# Patient Record
Sex: Female | Born: 1985 | Race: Black or African American | Hispanic: No | Marital: Single | Smoking: Current every day smoker
Health system: Southern US, Community
[De-identification: ages and names within clinical notes are randomized; demographics above are authoritative.]

## PROBLEM LIST (undated history)

## (undated) ENCOUNTER — Ambulatory Visit (HOSPITAL_COMMUNITY): Discharge status: Absent without leave | Payer: Medicaid Other

## (undated) ENCOUNTER — Emergency Department (HOSPITAL_COMMUNITY): Payer: No Typology Code available for payment source

## (undated) DIAGNOSIS — O24419 Gestational diabetes mellitus in pregnancy, unspecified control: Secondary | ICD-10-CM

## (undated) DIAGNOSIS — J45909 Unspecified asthma, uncomplicated: Secondary | ICD-10-CM

## (undated) DIAGNOSIS — N390 Urinary tract infection, site not specified: Secondary | ICD-10-CM

## (undated) DIAGNOSIS — O009 Unspecified ectopic pregnancy without intrauterine pregnancy: Secondary | ICD-10-CM

## (undated) HISTORY — PX: SALPINGECTOMY: SHX328

---

## 2012-10-17 DIAGNOSIS — O009 Unspecified ectopic pregnancy without intrauterine pregnancy: Secondary | ICD-10-CM

## 2012-10-17 HISTORY — DX: Unspecified ectopic pregnancy without intrauterine pregnancy: O00.90

## 2016-07-08 ENCOUNTER — Encounter (HOSPITAL_COMMUNITY): Payer: Self-pay

## 2016-07-08 ENCOUNTER — Emergency Department (HOSPITAL_COMMUNITY)
Admission: EM | Admit: 2016-07-08 | Discharge: 2016-07-08 | Disposition: A | Discharge status: Home / Self Care | Payer: Medicaid Other | Attending: Emergency Medicine | Admitting: Emergency Medicine

## 2016-07-08 DIAGNOSIS — J45909 Unspecified asthma, uncomplicated: Secondary | ICD-10-CM | POA: Insufficient documentation

## 2016-07-08 DIAGNOSIS — F17200 Nicotine dependence, unspecified, uncomplicated: Secondary | ICD-10-CM | POA: Insufficient documentation

## 2016-07-08 DIAGNOSIS — M62830 Muscle spasm of back: Secondary | ICD-10-CM | POA: Diagnosis not present

## 2016-07-08 DIAGNOSIS — M62838 Other muscle spasm: Secondary | ICD-10-CM

## 2016-07-08 HISTORY — DX: Unspecified ectopic pregnancy without intrauterine pregnancy: O00.90

## 2016-07-08 HISTORY — DX: Unspecified asthma, uncomplicated: J45.909

## 2016-07-08 MED ORDER — METHOCARBAMOL 500 MG PO TABS: 500.0000 mg | ORAL_TABLET | Freq: Two times a day (BID) | ORAL | 0 refills | Status: DC

## 2016-07-08 MED ORDER — IBUPROFEN 600 MG PO TABS: 600.0000 mg | ORAL_TABLET | Freq: Four times a day (QID) | ORAL | 0 refills | Status: DC | PRN

## 2016-07-08 NOTE — ED Triage Notes (Signed)
Pt reports neck spasms to left side of her neck and is painful to turn her head to the left and radiates down top her upper back onset 4 days ago. A&OX4.

## 2016-07-08 NOTE — ED Provider Notes (Signed)
[facility] Provider Note   CSN: [id number] Arrival date & time: 07/08/16  1314  By signing my name below, I, Linna DarnerRussell Turner, attest that this documentation has been prepared under the direction and in the presence of Melburn HakeNicole Hertha Gergen, [place]. Electronically Signed: Linna Darnerussell Turner, Scribe. 07/08/2016. 2:58 PM.  History   Chief Complaint Chief Complaint  Patient presents with  . neck spasms    The history is provided by the patient. No language interpreter was used.     HPI Comments: Darlene Gray is a 30 y.o. female who presents to the Emergency Department complaining of sudden onset, constant, left-sided neck pain for the last 4-5 days. Pt reports she has been having spasms in the left side of her neck. She endorses associated left trapezius pain and spasms as well as left-sided neck stiffness. She states her left-sided neck pain began to radiate into her head yesterday; she notes the pain does not radiate down her left arm. Pt reports her pain is worse when she sleeps on her left side or turns her head to the left. She notes she has tried stretching her neck and has used Bengay with no relief of her pain. Pt has not tried any oral medications for her pain. 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 She is right hand dominant. Pt denies recent falls or injuries. Pt further denies fever, HA, left shoulder pain, midline back pain, numbness/tingling, fever, CP, SOB, abdominal pain, or any other associated symptoms.  Past Medical History:  Diagnosis Date  . Asthma   . Ectopic pregnancy     There are no active problems to display for this patient.   History reviewed. No pertinent surgical history.  OB History    No data available       Home Medications    Prior to Admission medications   Not on File    Family History No family history on file.  Social History Social History  Substance Use  Topics  . Smoking status: Current Every Day Smoker  . Smokeless tobacco: Never Used  . Alcohol use Yes     Allergies   Shellfish allergy   Review of Systems Review of Systems  Constitutional: Negative for fever.  Respiratory: Negative for shortness of breath.   Cardiovascular: Negative for chest pain.  Gastrointestinal: Negative for abdominal pain.  Musculoskeletal: Positive for myalgias (left trapezius), neck pain (left side) and neck stiffness (left side).  Neurological: Negative for numbness.  All other systems reviewed and are negative.   Physical Exam Updated Vital Signs BP 124/86   Pulse 106   Temp 98.9 F (37.2 C) (Oral)   Resp 18   LMP 07/08/2016   SpO2 100%   Physical Exam  Constitutional: She is oriented to person, place, and time. She appears well-developed and well-nourished.  HENT:  Head: Normocephalic and atraumatic.  Eyes: Conjunctivae and EOM are normal. Right eye exhibits no discharge. Left eye exhibits no discharge. No scleral icterus.  Neck: Normal range of motion. Neck supple.  Cardiovascular: Normal rate, regular rhythm, normal heart sounds and intact distal pulses.   Heart rate 92.  Pulmonary/Chest: Effort normal and breath sounds normal.  Abdominal: Soft. She exhibits no distension.  Musculoskeletal: She exhibits no edema.  Tenderness to palpation over left cervical paraspinal muscles and left upper trapezius, no palpable muscle spasm noted. No midline C, T, or L tenderness. Full range  of motion of neck and back. Full range of motion of bilateral upper and lower extremities, with 5/5 strength. Sensation intact. 2+ radial and PT pulses. Cap refill <2 seconds. Patient able to stand and ambulate without assistance.   Neurological: She is alert and oriented to person, place, and time.  Skin: Skin is warm and dry.  Nursing note and vitals reviewed.   ED Treatments / Results  Labs (all labs ordered are listed, but only abnormal results are  displayed) Labs Reviewed - No data to display  EKG  EKG Interpretation None       Radiology No results found.  Procedures Procedures (including critical care time)  DIAGNOSTIC STUDIES: Oxygen Saturation is 100% on RA, normal by my interpretation.    COORDINATION OF CARE: 2:58 PM Discussed treatment plan with pt at bedside and pt agreed to plan.  Medications Ordered in ED Medications - No data to display   Initial Impression / Assessment and Plan / ED Course  I have reviewed the triage vital signs and the nursing notes.  Pertinent labs & imaging results that were available during my care of the patient were reviewed by me and considered in my medical decision making (see chart for details).  Clinical Course   Pt presents with left sided neck pain. Denies any recent known injury or trauma. VSS. Exam revealed TTP over left cervocal paraspinal muscles and upper trapezius. Remaining exam unremarkable. No midline tenderness. No neuro deficits. Suspect muscle strain/spasm. Plan to d/c pt home with NSAIDs, muscle relaxant and symptomatic tx. Advised to follow up with PCP as needed. Discussed return precautions.  I personally performed the services described in this documentation, which was scribed in my presence. The recorded information has been reviewed and is accurate.   Final Clinical Impressions(s) / ED Diagnoses   Final diagnoses:  None    New Prescriptions New Prescriptions   No medications on file     Barrett Henle, PA-C 07/08/16 1625    Zadie Rhine, MD 07/09/16 [phone]

## 2016-07-08 NOTE — Discharge Instructions (Signed)
Take your medication as prescribed as he ever pain relief. I also recommend applying heat to affected area for 15 minutes 3-4 times daily. Please follow up with a primary care provider from the Resource Guide provided below in one week if your symptoms have not improved. Please return to the Emergency Department if symptoms worsen or new onset of fever, headache, visual changes, lightheadedness, dizziness, neck stiffness, numbness, tingling, weakness.

## 2016-10-07 ENCOUNTER — Encounter (HOSPITAL_COMMUNITY): Payer: Self-pay | Admitting: Family Medicine

## 2016-10-07 ENCOUNTER — Ambulatory Visit (HOSPITAL_COMMUNITY)
Admission: EM | Admit: 2016-10-07 | Discharge: 2016-10-07 | Disposition: A | Discharge status: Home / Self Care | Payer: Medicaid Other | Attending: Family Medicine | Admitting: Family Medicine

## 2016-10-07 DIAGNOSIS — J069 Acute upper respiratory infection, unspecified: Secondary | ICD-10-CM | POA: Diagnosis not present

## 2016-10-07 DIAGNOSIS — B9789 Other viral agents as the cause of diseases classified elsewhere: Secondary | ICD-10-CM

## 2016-10-07 DIAGNOSIS — J4541 Moderate persistent asthma with (acute) exacerbation: Secondary | ICD-10-CM | POA: Diagnosis not present

## 2016-10-07 MED ORDER — DEXAMETHASONE SODIUM PHOSPHATE 10 MG/ML IJ SOLN
INTRAMUSCULAR | Status: AC
  Filled 2016-10-07: qty 1

## 2016-10-07 MED ORDER — IPRATROPIUM-ALBUTEROL 0.5-2.5 (3) MG/3ML IN SOLN
RESPIRATORY_TRACT | Status: AC
  Filled 2016-10-07: qty 3

## 2016-10-07 MED ORDER — IPRATROPIUM-ALBUTEROL 0.5-2.5 (3) MG/3ML IN SOLN
3.0000 mL | Freq: Once | RESPIRATORY_TRACT | Status: AC
  Administered 2016-10-07: 3 mL via RESPIRATORY_TRACT

## 2016-10-07 MED ORDER — ALBUTEROL SULFATE (2.5 MG/3ML) 0.083% IN NEBU
INHALATION_SOLUTION | RESPIRATORY_TRACT | Status: AC
  Filled 2016-10-07: qty 3

## 2016-10-07 MED ORDER — DEXAMETHASONE SODIUM PHOSPHATE 10 MG/ML IJ SOLN
10.0000 mg | Freq: Once | INTRAMUSCULAR | Status: AC
  Administered 2016-10-07: 10 mg via INTRAMUSCULAR

## 2016-10-07 MED ORDER — ALBUTEROL SULFATE (2.5 MG/3ML) 0.083% IN NEBU: 2.5000 mg | INHALATION_SOLUTION | Freq: Four times a day (QID) | RESPIRATORY_TRACT | 0 refills | Status: DC | PRN

## 2016-10-07 MED ORDER — PREDNISONE 50 MG PO TABS: ORAL_TABLET | ORAL | 0 refills | Status: DC

## 2016-10-07 MED ORDER — ALBUTEROL SULFATE (2.5 MG/3ML) 0.083% IN NEBU
2.5000 mg | INHALATION_SOLUTION | Freq: Once | RESPIRATORY_TRACT | Status: AC
  Administered 2016-10-07: 2.5 mg via RESPIRATORY_TRACT

## 2016-10-07 NOTE — ED Provider Notes (Signed)
CSN: [id number]     Arrival date & time 10/07/16  1803 History   First MD Initiated Contact with Patient 10/07/16 1837     Chief Complaint  Patient presents with  . URI  . Fever   (Consider location/radiation/quality/duration/timing/severity/associated sxs/prior Treatment) 30 year old female states that she is been having an asthma attack today. She has also developed fever in the last 24 hours. Complaining of cough, myalgias, headache, fatigue and malaise. She has lost her albuterol inhalers. She is taking no medications for symptoms.      Past Medical History:  Diagnosis Date  . Asthma   . Ectopic pregnancy    History reviewed. No pertinent surgical history. History reviewed. No pertinent family history. Social History  Substance Use Topics  . Smoking status: Current Every Day Smoker  . Smokeless tobacco: Never Used  . Alcohol use Yes   OB History    No data available     Review of Systems  Constitutional: Positive for activity change, chills, fatigue and fever. Negative for appetite change.  HENT: Positive for congestion, ear pain, postnasal drip and rhinorrhea. Negative for facial swelling.   Eyes: Negative.   Respiratory: Positive for cough, shortness of breath and wheezing.   Cardiovascular: Negative.   Gastrointestinal: Negative.   Musculoskeletal: Negative for neck pain and neck stiffness.  Skin: Negative for pallor and rash.  Neurological: Negative.     Allergies  Shellfish allergy  Home Medications   Prior to Admission medications   Medication Sig Start Date End Date Taking? Authorizing Provider  albuterol (PROVENTIL) (2.5 MG/3ML) 0.083% nebulizer solution Take 3 mLs (2.5 mg total) by nebulization every 6 (six) hours as needed for wheezing or shortness of breath. 10/07/16   Darlene Rasmussenavid [employer], NP  ibuprofen (ADVIL,MOTRIN) 600 MG tablet Take 1 tablet (600 mg total) by mouth every 6 (six) hours as needed. 07/08/16   Barrett HenleNicole Elizabeth Nadeau, PA-C  methocarbamol  (ROBAXIN) 500 MG tablet Take 1 tablet (500 mg total) by mouth 2 (two) times daily. 07/08/16   Barrett HenleNicole Elizabeth Nadeau, PA-C  predniSONE (DELTASONE) 50 MG tablet 1 tab po daily for 6 days. Take with food. 10/07/16   Darlene Rasmussenavid Emmaus Brandi, NP   Meds Ordered and Administered this Visit   Medications  ipratropium-albuterol (DUONEB) 0.5-2.5 (3) MG/3ML nebulizer solution 3 mL (3 mLs Nebulization Given 10/07/16 1915)  albuterol (PROVENTIL) (2.5 MG/3ML) 0.083% nebulizer solution 2.5 mg (2.5 mg Nebulization Given 10/07/16 1915)  dexamethasone (DECADRON) injection 10 mg (10 mg Intramuscular Given 10/07/16 1915)    BP 135/75   Pulse 94   Temp 99.8 F (37.7 C)   Resp 18   LMP 09/23/2016   SpO2 100%  No data found.   Physical Exam  Constitutional: She is oriented to person, place, and time. She appears well-developed and well-nourished. No distress.  HENT:  Head: Normocephalic and atraumatic.  Bilateral TMs with minor retraction otherwise clear.  Oropharynx with minor PND otherwise clear. Airway patent. No exudates.  Eyes: EOM are normal.  Neck: Normal range of motion. Neck supple.  Cardiovascular: Normal rate and regular rhythm.   Pulmonary/Chest: Effort normal. No respiratory distress. She has wheezes.  Lungs with only fair air movement. No adventitious sounds with low tidal volume. With forced cough the patient reveals diffuse coarseness and wheeze.  Musculoskeletal: Normal range of motion. She exhibits no edema.  Lymphadenopathy:    She has no cervical adenopathy.  Neurological: She is alert and oriented to person, place, and time.  Skin: Skin is warm  and dry. No rash noted.  Psychiatric: She has a normal mood and affect.  Nursing note and vitals reviewed.   Urgent Care Course   Clinical Course     Procedures (including critical care time)  Labs Review Labs Reviewed - No data to display  Imaging Review No results found.   Visual Acuity Review  Right Eye Distance:   Left Eye  Distance:   Bilateral Distance:    Right Eye Near:   Left Eye Near:    Bilateral Near:         MDM   1. Viral upper respiratory tract infection   2. Moderate persistent asthma with acute exacerbation    Dyspnea patient states she is breathing and feeling much better. Lungs with improved air movement and decrease wheeze. Sudafed PE 10 mg every 4 to 6 hours as needed for congestion Allegra or Zyrtec daily as needed for drainage and runny nose. For stronger antihistamine may take Chlor-Trimeton 2 to 4 mg every 4 to 6 hours, may cause drowsiness. Saline nasal spray used frequently. Ibuprofen 600 mg every 6 hours as needed for pain, discomfort or fever. Drink plenty of fluids and stay well-hydrated. Meds ordered this encounter  Medications  . ipratropium-albuterol (DUONEB) 0.5-2.5 (3) MG/3ML nebulizer solution 3 mL  . albuterol (PROVENTIL) (2.5 MG/3ML) 0.083% nebulizer solution 2.5 mg  . dexamethasone (DECADRON) injection 10 mg  . predniSONE (DELTASONE) 50 MG tablet    Sig: 1 tab po daily for 6 days. Take with food.    Dispense:  6 tablet    Refill:  0    Order Specific Question:   Supervising Provider    Answer:   Linna HoffKINDL, JAMES D [phone][[id number]]  . albuterol (PROVENTIL) (2.5 MG/3ML) 0.083% nebulizer solution    Sig: Take 3 mLs (2.5 mg total) by nebulization every 6 (six) hours as needed for wheezing or shortness of breath.    Dispense:  30 mL    Refill:  0    Order Specific Question:   Supervising Provider    Answer:   Linna HoffKINDL, JAMES D [[id number]]       Darlene Rasmussenavid Satcha Storlie, NP 10/07/16 1958

## 2016-10-07 NOTE — ED Triage Notes (Signed)
Pt here for fever, body aches, cough and asthma issues.

## 2016-10-07 NOTE — Discharge Instructions (Signed)
Sudafed PE 10 mg every 4 to 6 hours as needed for congestion °Allegra or Zyrtec daily as needed for drainage and runny nose. °For stronger antihistamine may take Chlor-Trimeton 2 to 4 mg every 4 to 6 hours, may cause drowsiness. °Saline nasal spray used frequently. °Ibuprofen 600 mg every 6 hours as needed for pain, discomfort or fever. °Drink plenty of fluids and stay well-hydrated. °

## 2018-03-19 ENCOUNTER — Encounter (HOSPITAL_COMMUNITY): Payer: Self-pay | Admitting: Emergency Medicine

## 2018-03-19 ENCOUNTER — Ambulatory Visit (HOSPITAL_COMMUNITY)
Admission: EM | Admit: 2018-03-19 | Discharge: 2018-03-19 | Disposition: A | Discharge status: Home / Self Care | Payer: Self-pay | Attending: Internal Medicine | Admitting: Internal Medicine

## 2018-03-19 DIAGNOSIS — M25511 Pain in right shoulder: Secondary | ICD-10-CM

## 2018-03-19 DIAGNOSIS — J4520 Mild intermittent asthma, uncomplicated: Secondary | ICD-10-CM

## 2018-03-19 DIAGNOSIS — M542 Cervicalgia: Secondary | ICD-10-CM

## 2018-03-19 DIAGNOSIS — W108XXA Fall (on) (from) other stairs and steps, initial encounter: Secondary | ICD-10-CM

## 2018-03-19 DIAGNOSIS — M62838 Other muscle spasm: Secondary | ICD-10-CM

## 2018-03-19 DIAGNOSIS — W19XXXA Unspecified fall, initial encounter: Secondary | ICD-10-CM

## 2018-03-19 MED ORDER — ALBUTEROL SULFATE (2.5 MG/3ML) 0.083% IN NEBU: 2.5000 mg | INHALATION_SOLUTION | Freq: Four times a day (QID) | RESPIRATORY_TRACT | 0 refills | Status: DC | PRN

## 2018-03-19 MED ORDER — CYCLOBENZAPRINE HCL 10 MG PO TABS: 10.0000 mg | ORAL_TABLET | Freq: Every day | ORAL | 0 refills | Status: DC

## 2018-03-19 MED ORDER — NAPROXEN 500 MG PO TABS: 500.0000 mg | ORAL_TABLET | Freq: Two times a day (BID) | ORAL | 0 refills | Status: DC

## 2018-03-19 MED ORDER — HYDROCODONE-ACETAMINOPHEN 5-325 MG PO TABS: 2.0000 | ORAL_TABLET | Freq: Four times a day (QID) | ORAL | 0 refills | Status: AC | PRN

## 2018-03-19 NOTE — ED Triage Notes (Signed)
Pt sts right sided neck and shoulder pain after fall on Saturday

## 2018-03-19 NOTE — Discharge Instructions (Signed)
Continue conservative management of rest, ice, heat, and gentle stretches Take naproxen as needed for pain relief (may cause abdominal discomfort, ulcers, and GI bleeds avoid taking with other NSAIDs) Norco prescribed.  Take as needed for break-through pain.  Do not take with other medications such as xanax, this may depress your breathing.  Do not drive or operate heavy machinery while taking this medication.   Take cyclobenzaprine at nighttime for symptomatic relief. Avoid driving or operating heavy machinery while using medication. PCP assistance initiated to help assist you with establishing care with a PCP in the area Return or go to the ER if you have any new or worsening symptoms (fever, chills, chest pain, abdominal pain, changes in bowel or bladder habits, pain radiating into lower legs, etc...)   Patient also requests albuterol inhaler refilled.

## 2018-03-19 NOTE — ED Provider Notes (Addendum)
[facility]   [id number] 03/19/18 Arrival Time: 1725  SUBJECTIVE: History from: patient. Darlene Gray is a 32 y.o. female complains of right side of neck and shoulder pain that began 2 days ago.  It began after she tripped and fell down approximately 8 steps, but caught herself with her right arm on the bannister.  Localizes the pain to the right shoulder and radiates towards neck.  Describes the pain as constant and sharp in character.  Has tried xanax and OTC pain medication without relief.  Symptoms are made worse with neck ROM.  Reports having physical therapy in the past for the right shoulder following a previous injury.  Denies fever, chills, erythema, ecchymosis, effusion, weakness, numbness and tingling.      Patient also has hx of asthma and requests inhaler to be filled today.    ROS: As per HPI.  Past Medical History:  Diagnosis Date  . Asthma   . Ectopic pregnancy    History reviewed. No pertinent surgical history. Allergies  Allergen Reactions  . Shellfish Allergy     Swelling    No current facility-administered medications on file prior to encounter.    Current Outpatient Medications on File Prior to Encounter  Medication Sig Dispense Refill  . ibuprofen (ADVIL,MOTRIN) 600 MG tablet Take 1 tablet (600 mg total) by mouth every 6 (six) hours as needed. 30 tablet 0  . methocarbamol (ROBAXIN) 500 MG tablet Take 1 tablet (500 mg total) by mouth 2 (two) times daily. (Patient not taking: Reported on 03/19/2018) 20 tablet 0  . predniSONE (DELTASONE) 50 MG tablet 1 tab po daily for 6 days. Take with food. 6 tablet 0   Social History   Socioeconomic History  . Marital status: Single    Spouse name: Not on file  . Number of children: Not on file  . Years of education: Not on file  . Highest education level: Not on file  Occupational History  . Not on file  Social Needs  . Financial resource strain: Not on file  . Food insecurity:    Worry: Not on file   Inability: Not on file  . Transportation needs:    Medical: Not on file    Non-medical: Not on file  Tobacco Use  . Smoking status: Current Every Day Smoker  . Smokeless tobacco: Never Used  Substance and Sexual Activity  . Alcohol use: Yes  . Drug use: Not on file  . Sexual activity: Not on file  Lifestyle  . Physical activity:    Days per week: Not on file    Minutes per session: Not on file  . Stress: Not on file  Relationships  . Social connections:    Talks on phone: Not on file    Gets together: Not on file    Attends religious service: Not on file    Active member of club or organization: Not on file    Attends meetings of clubs or organizations: Not on file    Relationship status: Not on file  . Intimate partner violence:    Fear of current or ex partner: Not on file    Emotionally abused: Not on file    Physically abused: Not on file    Forced sexual activity: Not on file  Other Topics Concern  . Not on file  Social History Narrative  . Not on file   History reviewed. No pertinent family history.  OBJECTIVE:  Vitals:   03/19/18 1826  BP:  129/87  Pulse: 93  Resp: 18  Temp: 98.6 F (37 C)  TempSrc: Oral  SpO2: 100%    General appearance: AOx3; in no acute distress.  Head: NCAT Lungs: CTA bilaterally Heart: RRR.  Clear S1 and S2 without murmur, gallops, or rubs.  Radial pulses 2+ bilaterally. Musculoskeletal:Neck and right shoulder  Inspection: Skin warm, dry, clear and intact without obvious erythema, effusion, or ecchymosis.  Palpation: diffusely tender about the sternocleidomastoid; nontender about the clavicle, AC joint, shoulder joint, humerus, or trapezius.  No c-spine tenderness ROM: FROM active and passive Strength: 5/5 shld abduction, 5/5 shld adduction, 5/5 elbow flexion, 5/5 elbow extension, 5/5 grip strength Sensation intact about the UE Skin: warm and dry Neurologic: Ambulates without difficulty Psychological: alert and cooperative;  normal mood and affect   MDM:  Given patient's PE and nature of injury x-rays were not necessary at this time. Patient also states she would like to avoid further radiation exposure if possible.  Nature of injury appears musculoskeletal.  Naproxen prescribed, norco for breakthrough pain, and cyclobenzaprine as needed for nighttime relief.  Strict return and ER precautions given.    ASSESSMENT & PLAN:  1. Fall, initial encounter   2. Muscle spasms of neck   3. Mild intermittent asthma, unspecified whether complicated     Meds ordered this encounter  Medications  . HYDROcodone-acetaminophen (NORCO/VICODIN) 5-325 MG tablet    Sig: Take 2 tablets by mouth every 6 (six) hours as needed for up to 5 days.    Dispense:  10 tablet    Refill:  0    Order Specific Question:   Supervising Provider    Answer:   Isa Rankin [phone]  . cyclobenzaprine (FLEXERIL) 10 MG tablet    Sig: Take 1 tablet (10 mg total) by mouth at bedtime.    Dispense:  12 tablet    Refill:  0    Order Specific Question:   Supervising Provider    Answer:   Isa Rankin [phone]  . naproxen (NAPROSYN) 500 MG tablet    Sig: Take 1 tablet (500 mg total) by mouth 2 (two) times daily.    Dispense:  30 tablet    Refill:  0    Order Specific Question:   Supervising Provider    Answer:   Isa Rankin [phone]  . albuterol (PROVENTIL) (2.5 MG/3ML) 0.083% nebulizer solution    Sig: Take 3 mLs (2.5 mg total) by nebulization every 6 (six) hours as needed for wheezing or shortness of breath.    Dispense:  30 mL    Refill:  0    Order Specific Question:   Supervising Provider    Answer:   Isa Rankin [[id number]]    Continue conservative management of rest, ice, heat, and gentle stretches Take naproxen as needed for pain relief (may cause abdominal discomfort, ulcers, and GI bleeds avoid taking with other NSAIDs) Norco prescribed.  Take as needed for break-through pain.  Do not take with other  medications such as xanax, this may depress your breathing.  Do not drive or operate heavy machinery while taking this medication.   Take cyclobenzaprine at nighttime for symptomatic relief. Avoid driving or operating heavy machinery while using medication. PCP assistance initiated to help assist you with establishing care with a PCP in the area Return or go to the ER if you have any new or worsening symptoms (fever, chills, chest pain, abdominal pain, changes in bowel or bladder habits, pain radiating into lower  legs, etc...)   Patient also requests albuterol inhaler refilled.    [place] Controlled Substances Registry consulted for this patient. I feel the risk/benefit ratio today is favorable for proceeding with this prescription for a controlled substance. Medication sedation precautions given.  Reviewed expectations re: course of current medical issues. Questions answered. Outlined signs and symptoms indicating need for more acute intervention. Patient verbalized understanding. After Visit Summary given.    Rennis Harding, PA-C 03/19/18 2013    Rennis Harding, PA-C 03/19/18 2013

## 2018-11-07 ENCOUNTER — Other Ambulatory Visit (HOSPITAL_COMMUNITY): Payer: Self-pay | Admitting: *Deleted

## 2018-11-07 DIAGNOSIS — N6320 Unspecified lump in the left breast, unspecified quadrant: Secondary | ICD-10-CM

## 2018-11-07 DIAGNOSIS — N644 Mastodynia: Secondary | ICD-10-CM

## 2018-12-03 ENCOUNTER — Encounter (HOSPITAL_COMMUNITY): Payer: Self-pay | Admitting: Emergency Medicine

## 2018-12-03 ENCOUNTER — Ambulatory Visit (HOSPITAL_COMMUNITY)
Admission: EM | Admit: 2018-12-03 | Discharge: 2018-12-03 | Disposition: A | Discharge status: Home / Self Care | Payer: Self-pay | Attending: Internal Medicine | Admitting: Internal Medicine

## 2018-12-03 ENCOUNTER — Other Ambulatory Visit: Payer: Self-pay

## 2018-12-03 DIAGNOSIS — M546 Pain in thoracic spine: Secondary | ICD-10-CM

## 2018-12-03 DIAGNOSIS — M25511 Pain in right shoulder: Secondary | ICD-10-CM

## 2018-12-03 MED ORDER — CYCLOBENZAPRINE HCL 5 MG PO TABS: 5.0000 mg | ORAL_TABLET | Freq: Three times a day (TID) | ORAL | 0 refills | Status: DC | PRN

## 2018-12-03 MED ORDER — MELOXICAM 7.5 MG PO TABS: 7.5000 mg | ORAL_TABLET | Freq: Every day | ORAL | 0 refills | Status: DC

## 2018-12-03 NOTE — ED Triage Notes (Signed)
Woke Saturday with a painful shoulder.  Now having pain in right lower back..  No known injury Patient's [birth year] was [birth year] and woke Saturday with shoulder complaint.  Patient is right handed.

## 2018-12-03 NOTE — ED Provider Notes (Signed)
  MRN: [id number] DOB: [birth year]  Subjective:   Darlene Gray is a 33 y.o. female presenting for 2-day history of persistent sharp, aching right shoulder pain now having right upper and mid back pain.  Has not tried any medications for relief.  She does not take any medications chronically.  Reports that she did drink quite a bit over the weekend for her birthday and thinks she may have slept wrong.  She cannot recall any falls, trauma and denies bruising, redness, warmth, laceration.   Allergies  Allergen Reactions  . Shellfish Allergy     Swelling     Past Medical History:  Diagnosis Date  . Asthma   . Ectopic pregnancy      History reviewed. No pertinent surgical history.  ROS  Objective:   Vitals: BP (!) 137/96 (BP Location: Left Arm)   Pulse 88   Temp 98 F (36.7 C) (Temporal)   Resp 20   SpO2 96%   Physical Exam Constitutional:      General: She is not in acute distress.    Appearance: Normal appearance. She is well-developed. She is not ill-appearing.  HENT:     Head: Normocephalic and atraumatic.     Nose: Nose normal.     Mouth/Throat:     Mouth: Mucous membranes are moist.     Pharynx: Oropharynx is clear.  Eyes:     General: No scleral icterus.    Extraocular Movements: Extraocular movements intact.     Pupils: Pupils are equal, round, and reactive to light.  Cardiovascular:     Rate and Rhythm: Normal rate.  Pulmonary:     Effort: Pulmonary effort is normal.  Musculoskeletal:     Right shoulder: She exhibits decreased range of motion (External rotation, abduction greater than 90 degrees), tenderness (About her deltoids) and spasm (Trapezius muscle). She exhibits no bony tenderness, no swelling, no effusion, no crepitus, no deformity, no laceration and normal strength.     Cervical back: She exhibits tenderness (Over area depicted) and spasm.       Back:  Skin:    General: Skin is warm and dry.  Neurological:     General: No focal deficit  present.     Mental Status: She is alert and oriented to person, place, and time.  Psychiatric:        Mood and Affect: Mood normal.        Behavior: Behavior normal.     Assessment and Plan :   Acute pain of right shoulder  Acute right-sided thoracic back pain  We will use conservative management including daily adequate hydration and meloxicam and Flexeril.  Counseled patient on some shoulder rehab exercises.  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 Counseled patient on potential for adverse effects with medications prescribed today, patient verbalized understanding. ER and return-to-clinic precautions discussed, patient verbalized understanding.    Wallis Bamberg, [place] 12/03/18 1828

## 2018-12-06 ENCOUNTER — Ambulatory Visit
Admission: RE | Admit: 2018-12-06 | Discharge: 2018-12-06 | Disposition: A | Discharge status: Home / Self Care | Payer: Medicaid Other | Source: Ambulatory Visit | Attending: Obstetrics and Gynecology | Admitting: Obstetrics and Gynecology

## 2018-12-06 ENCOUNTER — Ambulatory Visit (HOSPITAL_COMMUNITY)
Admission: RE | Admit: 2018-12-06 | Discharge: 2018-12-06 | Disposition: A | Discharge status: Home / Self Care | Payer: Self-pay | Source: Ambulatory Visit | Attending: Obstetrics and Gynecology | Admitting: Obstetrics and Gynecology

## 2018-12-06 ENCOUNTER — Ambulatory Visit: Payer: Medicaid Other

## 2018-12-06 ENCOUNTER — Encounter (HOSPITAL_COMMUNITY): Payer: Self-pay

## 2018-12-06 VITALS — BP 120/82 | Wt 198.0 lb

## 2018-12-06 DIAGNOSIS — N6320 Unspecified lump in the left breast, unspecified quadrant: Secondary | ICD-10-CM

## 2018-12-06 DIAGNOSIS — N6321 Unspecified lump in the left breast, upper outer quadrant: Secondary | ICD-10-CM

## 2018-12-06 DIAGNOSIS — N644 Mastodynia: Secondary | ICD-10-CM

## 2018-12-06 DIAGNOSIS — Z01419 Encounter for gynecological examination (general) (routine) without abnormal findings: Secondary | ICD-10-CM

## 2018-12-06 DIAGNOSIS — N6322 Unspecified lump in the left breast, upper inner quadrant: Secondary | ICD-10-CM

## 2018-12-06 NOTE — Patient Instructions (Signed)
Explained breast self awareness with Darlene Gray. Let patient know that if today's Pap smear is normal that her next Pap smear is due in one year since she has only had one normal Pap smear since her abnormal Pap smear. Referred [facility] to the [facility] for a diagnostic mammogram and left breast ultrasound. Appointment scheduled for Thursday, December 06, 2018 at 1520. Patient aware of appointment and will be there. Let patient know will follow up with her within the next couple weeks with results of Pap smear by letter or phone. Discussed smoking cessation with patient. Referred to the [facility] Quitline and gave resources to free smoking cessation classes at [facility]. Darlene Gray verbalized understanding.  Darlene Gray, Darlene Maser, RN 2:40 PM

## 2018-12-06 NOTE — Progress Notes (Signed)
Complaints of a left breast lump x 2-3 months and bilateral breast pain x 6 months. Patient states the pain comes and goes. Patient rates the pain at a 6 out of 10.  Pap Smear: Pap smear completed today. Last Pap smear was in 2017 in [place]  and normal per patient. Per patient has a history of an abnormal Pap smear in 2015 that the Pap smear in 2017 was her follow-up. No Pap smear results are in Epic.  Physical exam: Breasts Breasts symmetrical. No skin abnormalities bilateral breasts. No nipple retraction bilateral breasts. No nipple discharge bilateral breasts. No lymphadenopathy. No lumps palpated right breast. Palpated two bb sized mobile lumps within the left breast at 2 o'clock 10 cm from the nipple and 11 o'clock 10 cm from the nipple. No complaints of pain or tenderness on exam. Referred patient to the [facility] for a diagnostic mammogram and left breast ultrasound. Appointment scheduled for Thursday, December 06, 2018 at 1520.        Pelvic/Bimanual   Ext Genitalia No lesions, no swelling and no discharge observed on external genitalia.         Vagina Vagina pink and normal texture. No lesions or discharge observed in vagina.          Cervix Cervix is present. Cervix pink and of normal texture. No discharge observed.     Uterus Uterus is present and palpable. Uterus in normal position and normal size.        Adnexae Bilateral ovaries present and palpable. No tenderness on palpation.         Rectovaginal No rectal exam completed today since patient had no rectal complaints. No skin abnormalities observed on exam.    Smoking History: Patient is a current smoker. Discussed smoking cessation with patient. Referred to the [facility] Quitline and gave resources to free smoking cessation classes at [facility].  Patient Navigation: Patient education provided. Access to services provided for patient through [facility] program.   Breast and Cervical Cancer Risk  Assessment: Patient has no family history of breast cancer, known genetic mutations, or radiation treatment to the chest before age 64. Patient has no history of cervical dysplasia, immunocompromised, or DES exposure in-utero. Breast Cancer risk assessment completed. No breast cancer risk calculated due to patient is less than 42 years old.

## 2018-12-07 ENCOUNTER — Encounter (HOSPITAL_COMMUNITY): Payer: Self-pay | Admitting: *Deleted

## 2018-12-11 LAB — CYTOLOGY - PAP
Diagnosis: UNDETERMINED — AB
HPV: DETECTED — AB

## 2018-12-19 ENCOUNTER — Telehealth (HOSPITAL_COMMUNITY): Payer: Self-pay | Admitting: *Deleted

## 2018-12-19 NOTE — Telephone Encounter (Signed)
Telephoned patient at home number and advised patient of abnormal pap smear results. Advised patient would need colposcopy and was scheduled at [facility] March 11 8:55. Answered all questions about procedure. Patient voiced understanding.

## 2018-12-26 ENCOUNTER — Other Ambulatory Visit: Payer: Self-pay

## 2018-12-26 ENCOUNTER — Encounter: Payer: Self-pay | Admitting: Obstetrics and Gynecology

## 2018-12-26 ENCOUNTER — Other Ambulatory Visit (HOSPITAL_COMMUNITY)
Admission: RE | Admit: 2018-12-26 | Discharge: 2018-12-26 | Disposition: A | Discharge status: Home / Self Care | Payer: No Typology Code available for payment source | Source: Ambulatory Visit | Attending: Obstetrics and Gynecology | Admitting: Obstetrics and Gynecology

## 2018-12-26 ENCOUNTER — Ambulatory Visit (INDEPENDENT_AMBULATORY_CARE_PROVIDER_SITE_OTHER): Payer: Self-pay | Admitting: Obstetrics and Gynecology

## 2018-12-26 VITALS — BP 131/90 | HR 82 | Ht 62.0 in | Wt 204.2 lb

## 2018-12-26 DIAGNOSIS — R87810 Cervical high risk human papillomavirus (HPV) DNA test positive: Secondary | ICD-10-CM | POA: Insufficient documentation

## 2018-12-26 DIAGNOSIS — N870 Mild cervical dysplasia: Secondary | ICD-10-CM

## 2018-12-26 DIAGNOSIS — R87610 Atypical squamous cells of undetermined significance on cytologic smear of cervix (ASC-US): Secondary | ICD-10-CM | POA: Insufficient documentation

## 2018-12-26 LAB — POCT PREGNANCY, URINE: PREG TEST UR: NEGATIVE

## 2018-12-26 NOTE — Progress Notes (Signed)
Colposcopy Procedure Note  Pre-operative Diagnosis: ASCUS, positive high risk HPV 12/06/18 H/o abnormal pap 2015, reports "they wanted to do biopsy, and wanted to repeat pap, if pap normal, then did not need biopsy, then pap was normal"  Post-operative Diagnosis: CIN1  Indications:  ASCUS, positive high risk HPV  Procedure Details  LMP 12/07/18; UPT negative.    The risks (including infection, bleeding, pain) and benefits of the procedure were explained to the patient and written informed consent was obtained.  The patient was placed in the dorsal lithotomy position. A Graves was speculum inserted in the vagina, and the cervix was visualized.  The cervix was stained with acetic acid and visualized using the colposcope under magnification as well as with a green filter. Findings as below. Cervical biopsies were taken at 1, 7, 11. Endocervical canal stenotic, single toothed tenaculum placed onto cerivix. Endocervical curettage then performed in all four quadrants. Tenaculum removed. Small amount of bleeding noted that improved with pressure. Monsel's solution applied with good hemostasis noted. Patient tolerating procedure well.  Findings: acetowhite 360 degrees around os, vascularity at 1 o'clock, 8 o'clock, thickened white area at 11 o'clock  Adequate: yes  Specimens: cervical biopsy, ECC  Condition: Stable  Complications: None  Plan: The patient was advised to call for any fever or for prolonged or severe pain or bleeding. She was advised to use OTC analgesics as needed for mild to moderate pain. She was advised to avoid vaginal intercourse for 48 hours or until the bleeding has completely stopped.   Baldemar Lenis, M.D. Attending Center for [employer] [facility])

## 2018-12-28 ENCOUNTER — Telehealth: Payer: Self-pay

## 2018-12-28 NOTE — Telephone Encounter (Addendum)
-----   Message from Conan Bowens, MD sent at 12/28/2018  8:42 AM EDT ----- Please call patient let her know low grade abnormal cells found on colposcopy and negative ECC. She needs repeat pap + co-testing in 1 yr.  Notified pt results and f/u.  Pt was excited and did not have any other questions.

## 2019-01-11 ENCOUNTER — Encounter: Payer: Self-pay | Admitting: *Deleted

## 2019-03-11 ENCOUNTER — Ambulatory Visit (HOSPITAL_COMMUNITY)
Admission: EM | Admit: 2019-03-11 | Discharge: 2019-03-11 | Disposition: A | Discharge status: Home / Self Care | Payer: No Typology Code available for payment source | Attending: Family Medicine | Admitting: Family Medicine

## 2019-03-11 ENCOUNTER — Other Ambulatory Visit: Payer: Self-pay

## 2019-03-11 ENCOUNTER — Encounter (HOSPITAL_COMMUNITY): Payer: Self-pay

## 2019-03-11 DIAGNOSIS — S46811A Strain of other muscles, fascia and tendons at shoulder and upper arm level, right arm, initial encounter: Secondary | ICD-10-CM

## 2019-03-11 DIAGNOSIS — S161XXA Strain of muscle, fascia and tendon at neck level, initial encounter: Secondary | ICD-10-CM

## 2019-03-11 MED ORDER — IBUPROFEN 800 MG PO TABS: 800.0000 mg | ORAL_TABLET | Freq: Three times a day (TID) | ORAL | 0 refills | Status: DC

## 2019-03-11 MED ORDER — CYCLOBENZAPRINE HCL 5 MG PO TABS: 5.0000 mg | ORAL_TABLET | Freq: Two times a day (BID) | ORAL | 0 refills | Status: DC | PRN
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
## 2019-03-11 NOTE — Discharge Instructions (Signed)
Use anti-inflammatories for pain/swelling. You may take up to 800 mg Ibuprofen every 8 hours with food. You may supplement Ibuprofen with Tylenol [phone] mg every 8 hours.   You may use flexeril as needed to help with pain. This is a muscle relaxer and causes sedation- please use only at bedtime or when you will be home and not have to drive/work-begin with 1 tablet, may increase to 2 if needed  Alternate ice and heat to back/neck  Gentle stretching and range of motion of shoulder exercises  Please follow-up if not having any improvement in symptoms in 1 week, developing increased numbness or tingling

## 2019-05-23 ENCOUNTER — Emergency Department (HOSPITAL_COMMUNITY)
Admission: EM | Admit: 2019-05-23 | Discharge: 2019-05-23 | Disposition: A | Discharge status: Home / Self Care | Payer: Self-pay | Attending: Emergency Medicine | Admitting: Emergency Medicine

## 2019-05-23 ENCOUNTER — Encounter (HOSPITAL_COMMUNITY): Payer: Self-pay | Admitting: Emergency Medicine

## 2019-05-23 ENCOUNTER — Other Ambulatory Visit: Payer: Self-pay

## 2019-05-23 ENCOUNTER — Emergency Department (HOSPITAL_COMMUNITY): Payer: Self-pay

## 2019-05-23 DIAGNOSIS — Z8709 Personal history of other diseases of the respiratory system: Secondary | ICD-10-CM | POA: Insufficient documentation

## 2019-05-23 DIAGNOSIS — R1031 Right lower quadrant pain: Secondary | ICD-10-CM | POA: Insufficient documentation

## 2019-05-23 DIAGNOSIS — F17210 Nicotine dependence, cigarettes, uncomplicated: Secondary | ICD-10-CM | POA: Insufficient documentation

## 2019-05-23 DIAGNOSIS — R109 Unspecified abdominal pain: Secondary | ICD-10-CM

## 2019-05-23 LAB — I-STAT BETA HCG BLOOD, ED (MC, WL, AP ONLY): I-stat hCG, quantitative: <5 m[IU]/mL (ref <5)

## 2019-05-23 LAB — CBC
HCT: 39.5 % (ref 36.0–46.0)
Hemoglobin: 13 g/dL (ref 12.0–15.0)
MCH: 33 pg (ref 26.0–34.0)
MCHC: 32.9 g/dL (ref 30.0–36.0)
MCV: 100.3 fL — ABNORMAL HIGH (ref 80.0–100.0)
Platelets: 325 10*3/uL (ref 150–400)
RBC: 3.94 MIL/uL (ref 3.87–5.11)
RDW: 12.9 % (ref 11.5–15.5)
WBC: 9.1 10*3/uL (ref 4.0–10.5)
nRBC: 0 % (ref 0.0–0.2)

## 2019-05-23 LAB — BASIC METABOLIC PANEL
Anion gap: 11 (ref 5–15)
BUN: 13 mg/dL (ref 6–20)
CO2: 23 mmol/L (ref 22–32)
Calcium: 9 mg/dL (ref 8.9–10.3)
Chloride: 103 mmol/L (ref 98–111)
Creatinine, Ser: 0.97 mg/dL (ref 0.44–1.00)
GFR calc Af Amer: >60 mL/min (ref >60)
GFR calc non Af Amer: >60 mL/min (ref >60)
Glucose, Bld: 104 mg/dL — ABNORMAL HIGH (ref 70–99)
Potassium: 4 mmol/L (ref 3.5–5.1)
Sodium: 137 mmol/L (ref 135–145)

## 2019-05-23 LAB — URINALYSIS, ROUTINE W REFLEX MICROSCOPIC
Bilirubin Urine: NEGATIVE
Glucose, UA: NEGATIVE mg/dL
Ketones, ur: NEGATIVE mg/dL
Leukocytes,Ua: NEGATIVE
Nitrite: NEGATIVE
Protein, ur: NEGATIVE mg/dL
RBC / HPF: >50 RBC/hpf — ABNORMAL HIGH (ref 0–5)
Specific Gravity, Urine: 1.026 (ref 1.005–1.030)
pH: 5 (ref 5.0–8.0)

## 2019-05-23 MED ORDER — KETOROLAC TROMETHAMINE 60 MG/2ML IM SOLN
60.0000 mg | Freq: Once | INTRAMUSCULAR | Status: AC
  Administered 2019-05-23: 60 mg via INTRAMUSCULAR
  Filled 2019-05-23: qty 2

## 2019-05-23 MED ORDER — OXYCODONE-ACETAMINOPHEN 5-325 MG PO TABS
1.0000 | ORAL_TABLET | Freq: Once | ORAL | Status: AC
  Administered 2019-05-23: 05:00:00 1 via ORAL
  Filled 2019-05-23: qty 1

## 2019-05-23 MED ORDER — KETOROLAC TROMETHAMINE 30 MG/ML IJ SOLN: 30.0000 mg | Freq: Once | INTRAMUSCULAR | Status: DC

## 2019-05-23 MED ORDER — METHOCARBAMOL 500 MG PO TABS: 500.0000 mg | ORAL_TABLET | Freq: Three times a day (TID) | ORAL | 0 refills | Status: DC | PRN

## 2019-05-23 MED ORDER — ONDANSETRON 4 MG PO TBDP
4.0000 mg | ORAL_TABLET | Freq: Once | ORAL | Status: AC
  Administered 2019-05-23: 4 mg via ORAL
  Filled 2019-05-23: qty 1

## 2019-05-23 NOTE — ED Triage Notes (Signed)
Patient with right flank pain, states that it has been going on for about 3-4 days.  No nausea or vomiting.  Patient denies any urinary symptoms.  No injury per patient.  She states that she just couldn't get comfortable tonight and the pain was increasing.  She tried OTC ibuprofen with no relief.

## 2019-05-23 NOTE — ED Provider Notes (Signed)
TIME SEEN: 5:00 AM  CHIEF COMPLAINT: Right flank pain  HPI: Patient is a 33 year old female with history of asthma, previous ectopic pregnancy who presents to the emergency department with right flank pain that started yesterday.  No injury to her back that she can recall.  She states pain is worse with movement but not reproducible with palpation.  No fevers, cough, chest pain, shortness of breath, nausea, vomiting, diarrhea, dysuria, hematuria, abnormal vaginal discharge.  She is currently on her menstrual cycle.  She has never had a kidney stone.  No numbness, tingling, focal weakness, bowel or bladder incontinence.  ROS: See HPI Constitutional: no fever  Eyes: no drainage  ENT: no runny nose   Cardiovascular:  no chest pain  Resp: no SOB  GI: no vomiting GU: no dysuria Integumentary: no rash  Allergy: no hives  Musculoskeletal: no leg swelling  Neurological: no slurred speech ROS otherwise negative  PAST MEDICAL HISTORY/PAST SURGICAL HISTORY:  Past Medical History:  Diagnosis Date  . Asthma   . Ectopic pregnancy     MEDICATIONS:  Prior to Admission medications   Medication Sig Start Date End Date Taking? Authorizing Provider  cyclobenzaprine (FLEXERIL) 5 MG tablet Take 1-2 tablets (5-10 mg total) by mouth 2 (two) times daily as needed for muscle spasms. 03/11/19   Wieters, Hallie C, PA-C  ibuprofen (ADVIL) 800 MG tablet Take 1 tablet (800 mg total) by mouth 3 (three) times daily. 03/11/19   Wieters, Hallie C, PA-C    ALLERGIES:  Allergies  Allergen Reactions  . Shellfish Allergy     Swelling     SOCIAL HISTORY:  Social History   Tobacco Use  . Smoking status: Current Every Day Smoker  . Smokeless tobacco: Never Used  Substance Use Topics  . Alcohol use: Yes    Comment: occcassionally    FAMILY HISTORY: Family History  Problem Relation Age of Onset  . Healthy Mother   . Diabetes Maternal Grandmother     EXAM: BP 124/82   Pulse (!) 57   Temp 97.6 F  (36.4 C) (Oral)   Resp 16   LMP 05/22/2019 (Exact Date)   SpO2 99%  CONSTITUTIONAL: Alert and oriented and responds appropriately to questions. Well-appearing; well-nourished HEAD: Normocephalic EYES: Conjunctivae clear, pupils appear equal, EOMI ENT: normal nose; moist mucous membranes NECK: Supple, no meningismus, no nuchal rigidity, no LAD  CARD: RRR; S1 and S2 appreciated; no murmurs, no clicks, no rubs, no gallops RESP: Normal chest excursion without splinting or tachypnea; breath sounds clear and equal bilaterally; no wheezes, no rhonchi, no rales, no hypoxia or respiratory distress, speaking full sentences ABD/GI: Normal bowel sounds; non-distended; soft, non-tender, no rebound, no guarding, no peritoneal signs, no hepatosplenomegaly BACK:  The back appears normal and is non-tender to palpation, there is no CVA tenderness, no midline spinal tenderness or step-off or deformity, no redness or warmth, no ecchymosis or swelling, no rash EXT: Normal ROM in all joints; non-tender to palpation; no edema; normal capillary refill; no cyanosis, no calf tenderness or swelling    SKIN: Normal color for age and race; warm; no rash NEURO: Moves all extremities equally, normal sensation diffusely, normal gait, normal speech PSYCH: The patient's mood and manner are appropriate. Grooming and personal hygiene are appropriate.  MEDICAL DECISION MAKING: Patient with right flank pain.  May be musculoskeletal in nature but pain not reproducible with palpation.  Neurologically intact.  Doubt fracture, cauda equina, epidural abscess or hematoma, discitis or osteomyelitis, transverse myelitis.  I  do not feel she needs emergent imaging of her spine.  She does have blood in her urine but is on her menstrual cycle.  No signs of urinary tract infection or pyelonephritis.  Will obtain CT scan to evaluate for possible stone.  Will give Toradol for pain.  Abdominal exam benign.  Doubt appendicitis, colitis, cholecystitis,  bowel obstruction, perforation.  ED PROGRESS: Patient CT scan shows no acute abnormality.  Gallbladder, appendix, kidneys appear normal.  No bony abnormality seen of her spine.  Suspect musculoskeletal pain.  Will discharge with prescription of Robaxin and recommend alternating Tylenol and Motrin.  Discussed return precautions.   At this time, I do not feel there is any life-threatening condition present. I have reviewed and discussed all results (EKG, imaging, lab, urine as appropriate) and exam findings with patient/family. I have reviewed nursing notes and appropriate previous records.  I feel the patient is safe to be discharged home without further emergent workup and can continue workup as an outpatient as needed. Discussed usual and customary return precautions. Patient/family verbalize understanding and are comfortable with this plan.  Outpatient follow-up has been provided as needed. All questions have been answered.      , Layla MawKristen N, DO 05/23/19 [phone]

## 2019-05-23 NOTE — Discharge Instructions (Signed)
You may alternate Tylenol 1000 mg every 6 hours as needed for pain and Ibuprofen 800 mg every 8 hours as needed for pain.  Please take Ibuprofen with food. ° °

## 2020-08-29 ENCOUNTER — Other Ambulatory Visit: Payer: Self-pay

## 2020-08-29 ENCOUNTER — Ambulatory Visit (HOSPITAL_COMMUNITY)
Admission: EM | Admit: 2020-08-29 | Discharge: 2020-08-29 | Disposition: A | Discharge status: Home / Self Care | Payer: Self-pay | Attending: Internal Medicine | Admitting: Internal Medicine

## 2020-08-29 ENCOUNTER — Ambulatory Visit (INDEPENDENT_AMBULATORY_CARE_PROVIDER_SITE_OTHER): Payer: Self-pay

## 2020-08-29 DIAGNOSIS — M25511 Pain in right shoulder: Secondary | ICD-10-CM

## 2020-08-29 DIAGNOSIS — N760 Acute vaginitis: Secondary | ICD-10-CM

## 2020-08-29 DIAGNOSIS — Z3202 Encounter for pregnancy test, result negative: Secondary | ICD-10-CM

## 2020-08-29 LAB — POC URINE PREG, ED: Preg Test, Ur: NEGATIVE

## 2020-08-29 MED ORDER — KETOROLAC TROMETHAMINE 30 MG/ML IJ SOLN
INTRAMUSCULAR | Status: AC
  Filled 2020-08-29: qty 1

## 2020-08-29 MED ORDER — NYSTATIN-TRIAMCINOLONE 100000-0.1 UNIT/GM-% EX CREA: TOPICAL_CREAM | CUTANEOUS | 0 refills | Status: DC

## 2020-08-29 MED ORDER — PREDNISONE 10 MG (21) PO TBPK: ORAL_TABLET | ORAL | 0 refills | Status: DC

## 2020-08-29 MED ORDER — CYCLOBENZAPRINE HCL 5 MG PO TABS: 5.0000 mg | ORAL_TABLET | Freq: Three times a day (TID) | ORAL | 0 refills | Status: DC | PRN

## 2020-08-29 MED ORDER — FLUCONAZOLE 150 MG PO TABS: 150.0000 mg | ORAL_TABLET | Freq: Every day | ORAL | 0 refills | Status: DC

## 2020-08-29 MED ORDER — KETOROLAC TROMETHAMINE 30 MG/ML IJ SOLN
30.0000 mg | Freq: Once | INTRAMUSCULAR | Status: AC
  Administered 2020-08-29: 30 mg via INTRAMUSCULAR

## 2020-08-29 NOTE — ED Triage Notes (Signed)
PT reports for 2 weeks of rt shoulder pain and has not been able to sleep due to the pain. 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 Pt also reports she has vaginal itching due to meds she has been taking for Shoulder pain.

## 2020-08-29 NOTE — Discharge Instructions (Addendum)
Steroid pack as prescribed Flexeril as needed.  Follow up with sports medicine for continued symptoms.

## 2020-08-31 NOTE — ED Provider Notes (Signed)
[facility]    CSN: [id number] Arrival date & time: 08/29/20  1450      History   Chief Complaint Chief Complaint  Patient presents with  . Shoulder Pain  . Vaginal Itching    HPI Rashan Patient is a 34 y.o. female.   Patient is a 34 year old female who presents today for approximate 2 weeks of right shoulder pain.  This has been constant.  Worse with certain movements and unable to get comfortable during sleep.  Has been taking medicines for this pain that she has gotten from friends without much relief.  Has not been taking the medicine consistently.  Denies any falls or injuries.  Denies any numbness, tingling or weakness in the arm.  Believes she may have a yeast infection due to taking multiple different medications for her pain.      Past Medical History:  Diagnosis Date  . Asthma   . Ectopic pregnancy     There are no problems to display for this patient.   No past surgical history on file.  OB History    Gravida  2   Para  1   Term  1   Preterm      AB  1   Living  1     SAB      TAB      Ectopic  1   Multiple      Live Births  1            Home Medications    Prior to Admission medications   Medication Sig Start Date End Date Taking? Authorizing Provider  cyclobenzaprine (FLEXERIL) 5 MG tablet Take 1 tablet (5 mg total) by mouth 3 (three) times daily as needed for muscle spasms. 08/29/20   Dahlia Byes A, NP  fluconazole (DIFLUCAN) 150 MG tablet Take 1 tablet (150 mg total) by mouth daily. 08/29/20   Janace Aris, NP  nystatin-triamcinolone (MYCOLOG II) cream Apply to affected area daily 08/29/20   Dahlia Byes A, NP  predniSONE (STERAPRED UNI-PAK 21 TAB) 10 MG (21) TBPK tablet 6 tabs for 1 day, then 5 tabs for 1 das, then 4 tabs for 1 day, then 3 tabs for 1 day, 2 tabs for 1 day, then 1 tab for 1 day 08/29/20   Janace Aris, NP    Family History Family History  Problem Relation Age of Onset  . Healthy Mother   .  Diabetes Maternal Grandmother     Social History Social History   Tobacco Use  . Smoking status: Current Every Day Smoker  . Smokeless tobacco: Never Used  Vaping Use  . Vaping Use: Never used  Substance Use Topics  . Alcohol use: Yes    Comment: occcassionally  . Drug use: Never     Allergies   Shellfish allergy   Review of Systems Review of Systems   Physical Exam Triage Vital Signs ED Triage Vitals  Enc Vitals Group     BP 08/29/20 1534 124/89     Pulse Rate 08/29/20 1534 92     Resp 08/29/20 1534 18     Temp 08/29/20 1534 98.6 F (37 C)     Temp Source 08/29/20 1534 Oral     SpO2 08/29/20 1534 98 %     Weight 08/29/20 1538 210 lb (95.3 kg)     Height 08/29/20 1538 5\' 3"  (1.6 m)     Head Circumference --      Peak  Flow --      Pain Score 08/29/20 1537 8     Pain Loc --      Pain Edu? --      Excl. in GC? --    No data found.  Updated Vital Signs BP 124/89 (BP Location: Right Arm)   Pulse 92   Temp 98.6 F (37 C) (Oral)   Resp 18   Ht 5\' 3"  (1.6 m)   Wt 210 lb (95.3 kg)   LMP 08/16/2020   SpO2 98%   BMI 37.20 kg/m   Visual Acuity Right Eye Distance:   Left Eye Distance:   Bilateral Distance:    Right Eye Near:   Left Eye Near:    Bilateral Near:     Physical Exam Vitals and nursing note reviewed.  Constitutional:      General: She is not in acute distress.    Appearance: Normal appearance. She is not ill-appearing, toxic-appearing or diaphoretic.  HENT:     Head: Normocephalic.     Nose: Nose normal.  Eyes:     Conjunctiva/sclera: Conjunctivae normal.  Pulmonary:     Effort: Pulmonary effort is normal.  Musculoskeletal:        General: Tenderness present. Normal range of motion.     Cervical back: Normal range of motion.     Comments: Generalized shoulder tenderness.   Skin:    General: Skin is warm and dry.     Findings: No rash.  Neurological:     Mental Status: She is alert.  Psychiatric:        Mood and Affect: Mood  normal.      UC Treatments / Results  Labs (all labs ordered are listed, but only abnormal results are displayed) Labs Reviewed  POC URINE PREG, ED    EKG   Radiology DG Shoulder Right  Result Date: 08/29/2020 CLINICAL DATA:  Right shoulder pain for 2 weeks EXAM: RIGHT SHOULDER - 2+ VIEW COMPARISON:  None. FINDINGS: Internal rotation, external rotation, transscapular, and axillary views of the right shoulder are obtained. No fracture, subluxation, or dislocation. Joint spaces are well preserved. Right chest is clear. IMPRESSION: 1. Unremarkable right shoulder. Electronically Signed   By: 08/31/2020 M.D.   On: 08/29/2020 16:24    Procedures Procedures (including critical care time)  Medications Ordered in UC Medications  ketorolac (TORADOL) 30 MG/ML injection 30 mg (30 mg Intramuscular Given 08/29/20 1658)    Initial Impression / Assessment and Plan / UC Course  I have reviewed the triage vital signs and the nursing notes.  Pertinent labs & imaging results that were available during my care of the patient were reviewed by me and considered in my medical decision making (see chart for details).     Right shoulder pain No concerns on x ray Toradol given here for pain.  Could be arthritis versus rotator cuff tendinitis versus muscle strain Treating with prednisone taper, flexeril as needed.  Recommended follow-up with sports medicine for any continued symptoms.  Contact given.  Vaginal itching and irritation Treating for yeast infection Follow up as needed for continued or worsening symptoms   Final Clinical Impressions(s) / UC Diagnoses   Final diagnoses:  Acute pain of right shoulder     Discharge Instructions     Steroid pack as prescribed Flexeril as needed.  Follow up with sports medicine for continued symptoms.    ED Prescriptions    Medication Sig Dispense Auth. Provider   predniSONE (STERAPRED UNI-PAK 21  TAB) 10 MG (21) TBPK tablet 6 tabs for  1 day, then 5 tabs for 1 das, then 4 tabs for 1 day, then 3 tabs for 1 day, 2 tabs for 1 day, then 1 tab for 1 day 21 tablet Wallie Lagrand A, NP   cyclobenzaprine (FLEXERIL) 5 MG tablet Take 1 tablet (5 mg total) by mouth 3 (three) times daily as needed for muscle spasms. 30 tablet Demarius Archila A, NP   nystatin-triamcinolone (MYCOLOG II) cream Apply to affected area daily 15 g Gissella Niblack A, NP   fluconazole (DIFLUCAN) 150 MG tablet Take 1 tablet (150 mg total) by mouth daily. 2 tablet Dahlia Byes A, NP     PDMP not reviewed this encounter.   Janace Aris, NP 08/31/20 1108

## 2021-01-30 ENCOUNTER — Ambulatory Visit
Admission: EM | Admit: 2021-01-30 | Discharge: 2021-01-30 | Disposition: A | Discharge status: Home / Self Care | Payer: Medicaid Other | Attending: Emergency Medicine | Admitting: Emergency Medicine

## 2021-01-30 ENCOUNTER — Other Ambulatory Visit: Payer: Self-pay

## 2021-01-30 DIAGNOSIS — L02416 Cutaneous abscess of left lower limb: Secondary | ICD-10-CM

## 2021-01-30 MED ORDER — DOXYCYCLINE HYCLATE 100 MG PO CAPS: 100.0000 mg | ORAL_CAPSULE | Freq: Two times a day (BID) | ORAL | 0 refills | Status: AC

## 2021-01-30 MED ORDER — IBUPROFEN 800 MG PO TABS: 800.0000 mg | ORAL_TABLET | Freq: Three times a day (TID) | ORAL | 0 refills | Status: DC

## 2021-01-30 NOTE — ED Triage Notes (Signed)
Pt present abscess located in her upper left inner thigh. Pt states she noticed this last Tuesday. Pt states the area has gotten bigger and more painful

## 2021-01-30 NOTE — Discharge Instructions (Signed)
Please begin doxycycline for 10 days ° °Apply warm compresses/hot rags to area with massage to express further drainage especially the first 24-48 hours ° °Return if symptoms returning or not improving  °

## 2021-01-31 NOTE — ED Provider Notes (Addendum)
[name]    CSN: [id number] Arrival date & time: 01/30/21  1528      History   Chief Complaint Chief Complaint  Patient presents with  . Abscess    Upper inner left thigh     HPI Darlene Gray is a 35 y.o. female history of asthma presenting today for evaluation of an abscess.  Reports abscess to her left inner thigh.  Has been present for approximately 6 days.  Reports increased pain and swelling over the past 2 days.  Denies any known fevers.  Does have history of prior abscesses, but typically not like this.  Also reports small abscess to left breast.  Reports slight drainage since sitting in clinic  HPI  Past Medical History:  Diagnosis Date  . Asthma   . Ectopic pregnancy     There are no problems to display for this patient.   History reviewed. No pertinent surgical history.  OB History    Gravida  2   Para  1   Term  1   Preterm      AB  1   Living  1     SAB      IAB      Ectopic  1   Multiple      Live Births  1            Home Medications    Prior to Admission medications   Medication Sig Start Date End Date Taking? Authorizing Provider  doxycycline (VIBRAMYCIN) 100 MG capsule Take 1 capsule (100 mg total) by mouth 2 (two) times daily for 10 days. 01/30/21 02/09/21 Yes Aden Sek C, PA-C  ibuprofen (ADVIL) 800 MG tablet Take 1 tablet (800 mg total) by mouth 3 (three) times daily. 01/30/21  Yes Rolanda Campa C, PA-C  cyclobenzaprine (FLEXERIL) 5 MG tablet Take 1 tablet (5 mg total) by mouth 3 (three) times daily as needed for muscle spasms. 08/29/20   Janace Aris, NP  nystatin-triamcinolone (MYCOLOG II) cream Apply to affected area daily 08/29/20   Janace Aris, NP    Family History Family History  Problem Relation Age of Onset  . Healthy Mother   . Diabetes Maternal Grandmother     Social History Social History   Tobacco Use  . Smoking status: Current Every Day Smoker  . Smokeless tobacco: Never  Used  Vaping Use  . Vaping Use: Never used  Substance Use Topics  . Alcohol use: Yes    Comment: occcassionally  . Drug use: Never     Allergies   Shellfish allergy   Review of Systems Review of Systems  Constitutional: Negative for fatigue and fever.  HENT: Negative for mouth sores.   Eyes: Negative for visual disturbance.  Respiratory: Negative for shortness of breath.   Cardiovascular: Negative for chest pain.  Gastrointestinal: Negative for abdominal pain, nausea and vomiting.  Genitourinary: Negative for genital sores.  Musculoskeletal: Negative for arthralgias and joint swelling.  Skin: Positive for color change and wound. Negative for rash.  Neurological: Negative for dizziness, weakness, light-headedness and headaches.     Physical Exam Triage Vital Signs ED Triage Vitals  Enc Vitals Group     BP 01/30/21 1615 (!) 135/95     Pulse Rate 01/30/21 1615 (!) 106     Resp 01/30/21 1615 18     Temp 01/30/21 1615 97.6 F (36.4 C)     Temp Source 01/30/21 1615 Oral     SpO2 01/30/21  1615 97 %     Weight --      Height --      Head Circumference --      Peak Flow --      Pain Score 01/30/21 1616 10     Pain Loc --      Pain Edu? --      Excl. in GC? --    No data found.  Updated Vital Signs BP (!) 135/95 (BP Location: Left Arm)   Pulse (!) 106   Temp 97.6 F (36.4 C) (Oral)   Resp 18   SpO2 97%   Visual Acuity Right Eye Distance:   Left Eye Distance:   Bilateral Distance:    Right Eye Near:   Left Eye Near:    Bilateral Near:     Physical Exam Vitals and nursing note reviewed.  Constitutional:      Appearance: She is well-developed.     Comments: No acute distress  HENT:     Head: Normocephalic and atraumatic.     Nose: Nose normal.  Eyes:     Conjunctiva/sclera: Conjunctivae normal.  Cardiovascular:     Rate and Rhythm: Normal rate.  Pulmonary:     Effort: Pulmonary effort is normal. No respiratory distress.  Abdominal:     General:  There is no distension.  Musculoskeletal:        General: Normal range of motion.     Cervical back: Neck supple.  Skin:    General: Skin is warm and dry.     Comments: Left medial thigh with area of erythema and induration, slight central fluctuance and opening draining a serous fluid  Neurological:     Mental Status: She is alert and oriented to person, place, and time.      UC Treatments / Results  Labs (all labs ordered are listed, but only abnormal results are displayed) Labs Reviewed - No data to display  EKG   Radiology No results found.  Procedures Incision and Drainage  Date/Time: 01/30/2021 6:13 PM Performed by: Willodene Stallings, Edgemere C, PA-C Authorized by: Tykeria Wawrzyniak, Jeff C, PA-C   Consent:    Consent obtained:  Verbal   Consent given by:  Patient   Risks, benefits, and alternatives were discussed: yes     Risks discussed:  Incomplete drainage, pain and bleeding   Alternatives discussed:  Alternative treatment Universal protocol:    Patient identity confirmed:  Verbally with patient Location:    Type:  Abscess   Size:  5 cm   Location:  Lower extremity   Lower extremity location:  Leg   Leg location:  L upper leg Pre-procedure details:    Skin preparation:  Povidone-iodine Sedation:    Sedation type:  None Anesthesia:    Anesthesia method:  Local infiltration   Local anesthetic:  Lidocaine 1% w/o epi Procedure type:    Complexity:  Simple Procedure details:    Incision types:  Stab incision   Incision depth:  Subcutaneous   Drainage:  Bloody   Drainage amount:  Scant   Wound treatment:  Wound left open   Packing materials:  None Post-procedure details:    Procedure completion:  Tolerated well, no immediate complications   (including critical care time)  Medications Ordered in UC Medications - No data to display  Initial Impression / Assessment and Plan / UC Course  I have reviewed the triage vital signs and the nursing notes.  Pertinent labs  & imaging results that were available during  my care of the patient were reviewed by me and considered in my medical decision making (see chart for details).     Abscess to left thigh-attempted I&D given symptoms x6 days and worsening, no significant drainage obtained, initiating on doxycycline recommended warm compresses and monitoring for gradual resolution.  Discussed strict return precautions. Patient verbalized understanding and is agreeable with plan.  Final Clinical Impressions(s) / UC Diagnoses   Final diagnoses:  Abscess of left thigh     Discharge Instructions     Please begin doxycycline for 10 days  Apply warm compresses/hot rags to area with massage to express further drainage especially the first 24-48 hours  Return if symptoms returning or not improving    ED Prescriptions    Medication Sig Dispense Auth. Provider   doxycycline (VIBRAMYCIN) 100 MG capsule Take 1 capsule (100 mg total) by mouth 2 (two) times daily for 10 days. 20 capsule Khyrin Trevathan C, PA-C   ibuprofen (ADVIL) 800 MG tablet Take 1 tablet (800 mg total) by mouth 3 (three) times daily. 21 tablet Cassey Bacigalupo, Lassalle Comunidad C, PA-C     PDMP not reviewed this encounter.   Lew Dawes, PA-C 01/31/21 0814    Lew Dawes, PA-C 01/31/21 [phone]

## 2021-06-17 ENCOUNTER — Ambulatory Visit (HOSPITAL_COMMUNITY)
Admission: EM | Admit: 2021-06-17 | Discharge: 2021-06-17 | Disposition: A | Discharge status: Home / Self Care | Payer: Medicaid Other | Attending: Family Medicine | Admitting: Family Medicine

## 2021-06-17 ENCOUNTER — Other Ambulatory Visit: Payer: Self-pay

## 2021-06-17 ENCOUNTER — Encounter (HOSPITAL_COMMUNITY): Payer: Self-pay

## 2021-06-17 DIAGNOSIS — J069 Acute upper respiratory infection, unspecified: Secondary | ICD-10-CM | POA: Insufficient documentation

## 2021-06-17 DIAGNOSIS — Z20822 Contact with and (suspected) exposure to covid-19: Secondary | ICD-10-CM | POA: Insufficient documentation

## 2021-06-17 LAB — SARS CORONAVIRUS 2 (TAT 6-24 HRS): SARS Coronavirus 2: NEGATIVE

## 2021-06-17 MED ORDER — ALBUTEROL SULFATE HFA 108 (90 BASE) MCG/ACT IN AERS
1.0000 | INHALATION_SPRAY | Freq: Four times a day (QID) | RESPIRATORY_TRACT | 1 refills | Status: DC | PRN
Start: 2021-06-17 — End: 2022-01-13

## 2021-06-17 NOTE — ED Triage Notes (Signed)
Pt in with c/o dry cough and headache for a few days  States her sister just tested positive for covid

## 2021-06-17 NOTE — ED Provider Notes (Signed)
  [facility]   [id number] 06/17/21 Arrival Time: 6606  ASSESSMENT & PLAN:  1. Viral URI with cough    Discussed typical duration of viral illnesses. COVID-19 testing sent. OTC symptom care as needed.  Meds ordered this encounter  Medications   albuterol (VENTOLIN HFA) 108 (90 Base) MCG/ACT inhaler    Sig: Inhale 1-2 puffs into the lungs every 6 (six) hours as needed for wheezing or shortness of breath.    Dispense:  1 each    Refill:  1     Follow-up Information     [facility].   Specialty: Urgent Care Why: As needed. Contact information: [address] [place] [address] [phone]                Reviewed expectations re: course of current medical issues. Questions answered. Outlined signs and symptoms indicating need for more acute intervention. Understanding verbalized. After Visit Summary given.   SUBJECTIVE: History from: patient. Darlene Gray is a 35 y.o. female who presents with worries regarding COVID-19. Known COVID-19 contact: at work. Recent travel: none. Reports: cough and HA; several days. Denies: fever and difficulty breathing. Does report wheezing last evening; req albuterol inhaler. Normal PO intake without n/v/d.   OBJECTIVE:  Vitals:   06/17/21 1020  BP: (!) 144/103  Pulse: 88  Resp: 20  Temp: 98.4 F (36.9 C)  TempSrc: Oral  SpO2: 97%    General appearance: alert; no distress Eyes: PERRLA; EOMI; conjunctiva normal HENT: [place]; AT; with nasal congestion Neck: supple  Lungs: speaks full sentences without difficulty; unlabored; no active wheezing Extremities: no edema Skin: warm and dry Neurologic: normal gait Psychological: alert and cooperative; normal mood and affect  Labs:  Labs Reviewed  SARS CORONAVIRUS 2 (TAT 6-24 HRS)    Allergies  Allergen Reactions   Shellfish Allergy     Swelling     Past Medical History:  Diagnosis Date   Asthma    Ectopic  pregnancy    Social History   Socioeconomic History   Marital status: Single    Spouse name: Not on file   Number of children: 1   Years of education: Not on file   Highest education level: Bachelor's degree (e.g., BA, AB, BS)  Occupational History   Not on file  Tobacco Use   Smoking status: Every Day   Smokeless tobacco: Never  Vaping Use   Vaping Use: Never used  Substance and Sexual Activity   Alcohol use: Yes    Comment: occcassionally   Drug use: Never   Sexual activity: Yes    Birth control/protection: None, Condom  Other Topics Concern   Not on file  Social History Narrative   Not on file   Social Determinants of Health   Financial Resource Strain: Not on file  Food Insecurity: Not on file  Transportation Needs: Not on file  Physical Activity: Not on file  Stress: Not on file  Social Connections: Not on file  Intimate Partner Violence: Not on file   Family History  Problem Relation Age of Onset   Healthy Mother    Diabetes Maternal Grandmother    History reviewed. No pertinent surgical history.   Mardella Layman, MD 06/17/21 1044

## 2021-06-17 NOTE — Discharge Instructions (Signed)
You have been tested for COVID-19 today. °If your test returns positive, you will receive a phone call from [facility] regarding your results. °Negative test results are not called. °Both positive and negative results area always visible on MyChart. °If you do not have a MyChart account, sign up instructions are provided in your discharge papers. °Please do not hesitate to contact us should you have questions or concerns. ° °

## 2021-06-18 ENCOUNTER — Telehealth (HOSPITAL_COMMUNITY): Payer: Self-pay

## 2021-06-18 NOTE — Telephone Encounter (Signed)
Pt called to receive COVID test results. Pts results given.

## 2021-07-04 ENCOUNTER — Emergency Department (HOSPITAL_COMMUNITY): Payer: Self-pay

## 2021-07-04 ENCOUNTER — Encounter (HOSPITAL_COMMUNITY): Payer: Self-pay | Admitting: *Deleted

## 2021-07-04 ENCOUNTER — Other Ambulatory Visit: Payer: Self-pay

## 2021-07-04 ENCOUNTER — Encounter (HOSPITAL_COMMUNITY): Payer: Self-pay | Admitting: Emergency Medicine

## 2021-07-04 ENCOUNTER — Emergency Department (HOSPITAL_COMMUNITY)
Admission: EM | Admit: 2021-07-04 | Discharge: 2021-07-04 | Disposition: A | Discharge status: Home / Self Care | Payer: Self-pay | Attending: Student | Admitting: Student

## 2021-07-04 ENCOUNTER — Ambulatory Visit (HOSPITAL_COMMUNITY)
Admission: EM | Admit: 2021-07-04 | Discharge: 2021-07-04 | Disposition: A | Discharge status: Home / Self Care | Payer: Medicaid Other | Attending: Physician Assistant | Admitting: Physician Assistant

## 2021-07-04 DIAGNOSIS — F17210 Nicotine dependence, cigarettes, uncomplicated: Secondary | ICD-10-CM | POA: Insufficient documentation

## 2021-07-04 DIAGNOSIS — R1031 Right lower quadrant pain: Secondary | ICD-10-CM | POA: Insufficient documentation

## 2021-07-04 DIAGNOSIS — R1030 Lower abdominal pain, unspecified: Secondary | ICD-10-CM

## 2021-07-04 DIAGNOSIS — R000 Tachycardia, unspecified: Secondary | ICD-10-CM | POA: Insufficient documentation

## 2021-07-04 DIAGNOSIS — R1032 Left lower quadrant pain: Secondary | ICD-10-CM | POA: Insufficient documentation

## 2021-07-04 DIAGNOSIS — M545 Low back pain, unspecified: Secondary | ICD-10-CM | POA: Insufficient documentation

## 2021-07-04 DIAGNOSIS — J45909 Unspecified asthma, uncomplicated: Secondary | ICD-10-CM | POA: Insufficient documentation

## 2021-07-04 DIAGNOSIS — U071 COVID-19: Secondary | ICD-10-CM | POA: Insufficient documentation

## 2021-07-04 LAB — COMPREHENSIVE METABOLIC PANEL
ALT: 23 U/L (ref 0–44)
AST: 26 U/L (ref 15–41)
Albumin: 3.6 g/dL (ref 3.5–5.0)
Alkaline Phosphatase: 43 U/L (ref 38–126)
Anion gap: 10 (ref 5–15)
BUN: 8 mg/dL (ref 6–20)
CO2: 22 mmol/L (ref 22–32)
Calcium: 9.1 mg/dL (ref 8.9–10.3)
Chloride: 101 mmol/L (ref 98–111)
Creatinine, Ser: 1.06 mg/dL — ABNORMAL HIGH (ref 0.44–1.00)
GFR, Estimated: >60 mL/min (ref >60)
Glucose, Bld: 88 mg/dL (ref 70–99)
Potassium: 3.9 mmol/L (ref 3.5–5.1)
Sodium: 133 mmol/L — ABNORMAL LOW (ref 135–145)
Total Bilirubin: 0.6 mg/dL (ref 0.3–1.2)
Total Protein: 6.8 g/dL (ref 6.5–8.1)

## 2021-07-04 LAB — POCT URINALYSIS DIPSTICK, ED / UC
Bilirubin Urine: NEGATIVE
Glucose, UA: NEGATIVE mg/dL
Ketones, ur: NEGATIVE mg/dL
Leukocytes,Ua: NEGATIVE
Nitrite: NEGATIVE
Protein, ur: NEGATIVE mg/dL
Specific Gravity, Urine: 1.02 (ref 1.005–1.030)
Urobilinogen, UA: 0.2 mg/dL (ref 0.0–1.0)
pH: 6.5 (ref 5.0–8.0)

## 2021-07-04 LAB — CBC WITH DIFFERENTIAL/PLATELET
Abs Immature Granulocytes: 0.01 10*3/uL (ref 0.00–0.07)
Basophils Absolute: 0 10*3/uL (ref 0.0–0.1)
Basophils Relative: 1 %
Eosinophils Absolute: 0 10*3/uL (ref 0.0–0.5)
Eosinophils Relative: 1 %
HCT: 36.7 % (ref 36.0–46.0)
Hemoglobin: 12.6 g/dL (ref 12.0–15.0)
Immature Granulocytes: 0 %
Lymphocytes Relative: 14 %
Lymphs Abs: 0.7 10*3/uL (ref 0.7–4.0)
MCH: 32.3 pg (ref 26.0–34.0)
MCHC: 34.3 g/dL (ref 30.0–36.0)
MCV: 94.1 fL (ref 80.0–100.0)
Monocytes Absolute: 1.3 10*3/uL — ABNORMAL HIGH (ref 0.1–1.0)
Monocytes Relative: 25 %
Neutro Abs: 3.1 10*3/uL (ref 1.7–7.7)
Neutrophils Relative %: 59 %
Platelets: 293 10*3/uL (ref 150–400)
RBC: 3.9 MIL/uL (ref 3.87–5.11)
RDW: 12.8 % (ref 11.5–15.5)
WBC: 5.2 10*3/uL (ref 4.0–10.5)
nRBC: 0 % (ref 0.0–0.2)

## 2021-07-04 LAB — RESP PANEL BY RT-PCR (FLU A&B, COVID) ARPGX2
Influenza A by PCR: NEGATIVE
Influenza B by PCR: NEGATIVE
SARS Coronavirus 2 by RT PCR: POSITIVE — AB

## 2021-07-04 LAB — LACTIC ACID, PLASMA: Lactic Acid, Venous: 0.6 mmol/L (ref 0.5–1.9)

## 2021-07-04 LAB — POC URINE PREG, ED: Preg Test, Ur: NEGATIVE

## 2021-07-04 LAB — LIPASE, BLOOD: Lipase: 23 U/L (ref 11–51)

## 2021-07-04 MED ORDER — IOHEXOL 300 MG/ML  SOLN
100.0000 mL | Freq: Once | INTRAMUSCULAR | Status: AC | PRN
  Administered 2021-07-04: 100 mL via INTRAVENOUS

## 2021-07-04 MED ORDER — ACETAMINOPHEN 325 MG PO TABS
325.0000 mg | ORAL_TABLET | Freq: Once | ORAL | Status: AC
  Administered 2021-07-04: 325 mg via ORAL
  Filled 2021-07-04: qty 1

## 2021-07-04 NOTE — ED Provider Notes (Signed)
[facility] EMERGENCY DEPARTMENT Provider Note   CSN: [id number] Arrival date & time: 07/04/21  1620     History No chief complaint on file.   Brecklynn Jian is a 35 y.o. female who presents the emergency department for evaluation of abdominal pain, fever, back pain.  States that symptoms began today with an attendant pain in the right left lower quadrants and low back that does not radiate.  T-max 102 at home.  She was seen at urgent care who transferred her to the emergency department for laboratory and CT evaluation.  Pregnancy test negative and urinalysis unremarkable urgent care.  HPI     Past Medical History:  Diagnosis Date   Asthma    Ectopic pregnancy     There are no problems to display for this patient.   Past Surgical History:  Procedure Laterality Date   SALPINGECTOMY       OB History     Gravida  2   Para  1   Term  1   Preterm      AB  1   Living  1      SAB      IAB      Ectopic  1   Multiple      Live Births  1           Family History  Problem Relation Age of Onset   Healthy Mother    Diabetes Maternal Grandmother     Social History   Tobacco Use   Smoking status: Every Day    Types: Cigarettes   Smokeless tobacco: Never  Vaping Use   Vaping Use: Never used  Substance Use Topics   Alcohol use: Yes    Comment: occasionally   Drug use: Never    Home Medications Prior to Admission medications   Medication Sig Start Date End Date Taking? Authorizing Provider  albuterol (VENTOLIN HFA) 108 (90 Base) MCG/ACT inhaler Inhale 1-2 puffs into the lungs every 6 (six) hours as needed for wheezing or shortness of breath. 06/17/21   Mardella Layman, MD  cyclobenzaprine (FLEXERIL) 5 MG tablet Take 1 tablet (5 mg total) by mouth 3 (three) times daily as needed for muscle spasms. 08/29/20   Dahlia Byes A, NP  ibuprofen (ADVIL) 800 MG tablet Take 1 tablet (800 mg total) by mouth 3 (three) times daily. 01/30/21    Wieters, Hallie C, PA-C  nystatin-triamcinolone (MYCOLOG II) cream Apply to affected area daily 08/29/20   Janace Aris, NP    Allergies    Shellfish allergy  Review of Systems   Review of Systems  Constitutional:  Positive for fever. Negative for chills.  HENT:  Negative for ear pain and sore throat.   Eyes:  Negative for pain and visual disturbance.  Respiratory:  Negative for cough and shortness of breath.   Cardiovascular:  Negative for chest pain and palpitations.  Gastrointestinal:  Positive for abdominal pain. Negative for vomiting.  Genitourinary:  Negative for dysuria and hematuria.  Musculoskeletal:  Positive for back pain. Negative for arthralgias.  Skin:  Negative for color change and rash.  Neurological:  Negative for seizures and syncope.  All other systems reviewed and are negative.  Physical Exam Updated Vital Signs BP (!) 117/57 (BP Location: Right Arm)   Pulse (!) 107   Temp (!) 100.9 F (38.3 C) (Oral)   Resp (!) 22   LMP 06/10/2021 (Exact Date)   SpO2 97%   Physical Exam  Vitals and nursing note reviewed.  Constitutional:      General: She is not in acute distress.    Appearance: She is well-developed.  HENT:     Head: Normocephalic and atraumatic.  Eyes:     Conjunctiva/sclera: Conjunctivae normal.  Cardiovascular:     Rate and Rhythm: Normal rate and regular rhythm.     Heart sounds: No murmur heard. Pulmonary:     Effort: Pulmonary effort is normal. No respiratory distress.     Breath sounds: Normal breath sounds.  Abdominal:     Palpations: Abdomen is soft.     Tenderness: There is abdominal tenderness.  Musculoskeletal:     Cervical back: Neck supple.  Skin:    General: Skin is warm and dry.  Neurological:     Mental Status: She is alert.    ED Results / Procedures / Treatments   Labs (all labs ordered are listed, but only abnormal results are displayed) Labs Reviewed  RESP PANEL BY RT-PCR (FLU A&B, COVID) ARPGX2 - Abnormal;  Notable for the following components:      Result Value   SARS Coronavirus 2 by RT PCR POSITIVE (*)    All other components within normal limits  CBC WITH DIFFERENTIAL/PLATELET - Abnormal; Notable for the following components:   Monocytes Absolute 1.3 (*)    All other components within normal limits  COMPREHENSIVE METABOLIC PANEL - Abnormal; Notable for the following components:   Sodium 133 (*)    Creatinine, Ser 1.06 (*)    All other components within normal limits  LIPASE, BLOOD  LACTIC ACID, PLASMA  LACTIC ACID, PLASMA  URINALYSIS, ROUTINE W REFLEX MICROSCOPIC    EKG None  Radiology CT ABDOMEN PELVIS W CONTRAST  Result Date: 07/04/2021 CLINICAL DATA:  Acute abdominal pain, nonlocalized. Lower abdominal pain, back pain, fever, and chills since this morning. EXAM: CT ABDOMEN AND PELVIS WITH CONTRAST TECHNIQUE: Multidetector CT imaging of the abdomen and pelvis was performed using the standard protocol following bolus administration of intravenous contrast. CONTRAST:  OMNIPAQUE IOHEXOL 300 MG/ML  SOLN COMPARISON:  05/23/2019 FINDINGS: Lower chest: The lung bases are clear. Hepatobiliary: No focal liver abnormality is seen. No gallstones, gallbladder wall thickening, or biliary dilatation. Pancreas: Unremarkable. No pancreatic ductal dilatation or surrounding inflammatory changes. Spleen: Normal in size without focal abnormality. Adrenals/Urinary Tract: Adrenal glands are unremarkable. Kidneys are normal, without renal calculi, focal lesion, or hydronephrosis. Bladder is unremarkable. Stomach/Bowel: Stomach, small bowel, and colon are not abnormally distended. No wall thickening or inflammatory changes are appreciated. Appendix is normal. Vascular/Lymphatic: No significant vascular findings are present. Moderately prominent lymph nodes present in the mesentery and groin regions. Similar appearance to prior study. These are likely reactive. Reproductive: Uterus and ovaries are not  enlarged. Other: No free air or free fluid in the abdomen. Abdominal wall musculature appears intact. Musculoskeletal: No acute or significant osseous findings. IMPRESSION: No acute abnormalities demonstrated in the abdomen or pelvis. Electronically Signed   By: Burman Nieves M.D.   On: 07/04/2021 21:22    Procedures Procedures   Medications Ordered in ED Medications  acetaminophen (TYLENOL) tablet 325 mg (325 mg Oral Given 07/04/21 2042)  iohexol (OMNIPAQUE) 300 MG/ML solution 100 mL (100 mLs Intravenous Contrast Given 07/04/21 2110)    ED Course  I have reviewed the triage vital signs and the nursing notes.  Pertinent labs & imaging results that were available during my care of the patient were reviewed by me and considered in my medical decision  making (see chart for details).    MDM Rules/Calculators/A&P                           Patient seen the emergency department for evaluation of multiple complaints as seen above.  She arrives febrile to 100.9 and mildly tachycardic.  Physical exam was right lower quadrant and left lower quadrant tenderness to palpation.  Laboratory evaluation with mild hyponatremia 133, normal lactate, normal lipase, CBC unremarkable, patient is COVID-positive which likely explains most of her symptoms.  She is not a candidate for paxlovid therapy and she was given Tylenol which improved her symptoms.  Stable vital signs on discharge and she was discharged with return precautions of which she voiced understanding. Final Clinical Impression(s) / ED Diagnoses Final diagnoses:  None    Rx / DC Orders ED Discharge Orders     None        Sherriann Szuch, Wyn Forster, MD 07/04/21 2356

## 2021-07-04 NOTE — ED Triage Notes (Signed)
Pt sent from [facility].  Reports lower abd pain, back pain, fever, and chills since waking up this morning.  Denies urinary symptoms.

## 2021-07-04 NOTE — Discharge Instructions (Addendum)
   Report to ED immediately!! 

## 2021-07-04 NOTE — ED Provider Notes (Signed)
Emergency Medicine Provider Triage Evaluation Note  Darlene Gray , a 35 y.o. female  was evaluated in triage.  Pt complains of lower abdominal pain, back pain she states that she had a fever today of 102 this morning and states that she started having abdominal pain yesterday evening.  She endorses nausea and states that her abdominal pain is 10/10.  She denies any vomiting she has had no diarrhea melena hematochezia.  States that he has a history of ectopic pregnancy and is very concerned about similar symptoms.  She has not had any vaginal bleeding or vaginal discharge.  She had a negative urine pregnancy test at urgent care.  She is currently sexually active does not use protection is not planning for pregnancy however.  Review of Systems  Positive: Fever, abdominal pain, nausea Negative: Cough, congestion  Physical Exam  BP 112/76 (BP Location: Right Arm)   Pulse (!) 119   Temp 99.9 F (37.7 C) (Oral)   Resp 20   LMP 06/10/2021 (Exact Date)   SpO2 97%  Gen:   Awake, uncomfortable Resp:  Normal effort  MSK:   Moves extremities without difficulty  Other:  Patient appears quite uncomfortable No significant abdominal tenderness although patient points to right lower abdomen when indicating area of pain. No guarding or rebound.  No significant CVA tenderness.  No midline cervical, thoracic or lumbar tenderness to palpation.  Medical Decision Making  Medically screening exam initiated at 5:33 PM.  Appropriate orders placed.  [name] was informed that the remainder of the evaluation will be completed by another provider, this initial triage assessment does not replace that evaluation, and the importance of remaining in the ED until their evaluation is complete.  Patient very uncomfortable appearing 35 year old female Is complaining of abdominal pain but has a nontender abdomen. Does have an elevated temperature and states that she had a true fever earlier today Differential  includes ectopic pregnancy--less likely given negative pregnancy test at urgent care, appendicitis, nephrolithiasis Somewhat lower suspicion for ovarian torsion.  CT scan obtained initially.      Gailen Shelter, [place] 07/04/21 1736    Franne Forts, DO 07/06/21 1955

## 2021-07-04 NOTE — ED Provider Notes (Signed)
[facility]    CSN: [id number] Arrival date & time: 07/04/21  1504      History   Chief Complaint Chief Complaint  Patient presents with   Fever   Generalized Body Aches    HPI Darlene Gray is a 35 y.o. female.   Patient here today for evaluation of lower abdominal pain.  She reports that pain is a 10 on a 0-10 scale.  She has also had fever.  She denies any vomiting.  She has not had any diarrhea or blood in her stool.  She does have history of ectopic pregnancy   Fever Associated symptoms: no dysuria, no nausea and no vomiting    Past Medical History:  Diagnosis Date   Asthma    Ectopic pregnancy     There are no problems to display for this patient.   Past Surgical History:  Procedure Laterality Date   SALPINGECTOMY      OB History     Gravida  2   Para  1   Term  1   Preterm      AB  1   Living  1      SAB      IAB      Ectopic  1   Multiple      Live Births  1            Home Medications    Prior to Admission medications   Medication Sig Start Date End Date Taking? Authorizing Provider  albuterol (VENTOLIN HFA) 108 (90 Base) MCG/ACT inhaler Inhale 1-2 puffs into the lungs every 6 (six) hours as needed for wheezing or shortness of breath. 06/17/21   Mardella Layman, MD  cyclobenzaprine (FLEXERIL) 5 MG tablet Take 1 tablet (5 mg total) by mouth 3 (three) times daily as needed for muscle spasms. 08/29/20   Dahlia Byes A, NP  ibuprofen (ADVIL) 800 MG tablet Take 1 tablet (800 mg total) by mouth 3 (three) times daily. 01/30/21   Wieters, Hallie C, PA-C  nystatin-triamcinolone (MYCOLOG II) cream Apply to affected area daily 08/29/20   Janace Aris, NP    Family History Family History  Problem Relation Age of Onset   Healthy Mother    Diabetes Maternal Grandmother     Social History Social History   Tobacco Use   Smoking status: Every Day    Types: Cigarettes   Smokeless tobacco: Never  Vaping Use   Vaping Use:  Never used  Substance Use Topics   Alcohol use: Yes    Comment: occasionally   Drug use: Never     Allergies   Shellfish allergy   Review of Systems Review of Systems  Constitutional:  Positive for fever.  Eyes:  Negative for discharge and redness.  Gastrointestinal:  Positive for abdominal pain. Negative for nausea and vomiting.  Genitourinary:  Negative for dysuria.    Physical Exam Triage Vital Signs ED Triage Vitals  Enc Vitals Group     BP 07/04/21 1546 135/60     Pulse Rate 07/04/21 1546 (!) 120     Resp 07/04/21 1546 (!) 24     Temp 07/04/21 1546 99 F (37.2 C)     Temp Source 07/04/21 1546 Oral     SpO2 07/04/21 1546 100 %     Weight --      Height --      Head Circumference --      Peak Flow --  Pain Score 07/04/21 1547 10     Pain Loc --      Pain Edu? --      Excl. in GC? --    No data found.  Updated Vital Signs BP 135/60   Pulse (!) 120   Temp 99 F (37.2 C) (Oral)   Resp (!) 24   LMP 06/10/2021 (Exact Date)   SpO2 100%    Physical Exam Vitals and nursing note reviewed.  Constitutional:      General: She is in acute distress (rocking back and forth on exam table due to pain).  HENT:     Head: Normocephalic and atraumatic.  Eyes:     Conjunctiva/sclera: Conjunctivae normal.  Cardiovascular:     Rate and Rhythm: Tachycardia present.  Pulmonary:     Effort: Pulmonary effort is normal.  Neurological:     Mental Status: She is alert.     UC Treatments / Results  Labs (all labs ordered are listed, but only abnormal results are displayed) Labs Reviewed  POCT URINALYSIS DIPSTICK, ED / UC - Abnormal; Notable for the following components:      Result Value   Hgb urine dipstick SMALL (*)    All other components within normal limits  POC URINE PREG, ED    EKG   Radiology No results found.  Procedures Procedures (including critical care time)  Medications Ordered in UC Medications - No data to display  Initial Impression  / Assessment and Plan / UC Course  I have reviewed the triage vital signs and the nursing notes.  Pertinent labs & imaging results that were available during my care of the patient were reviewed by me and considered in my medical decision making (see chart for details).  Given severity of pain, fever, tachycardia, and history of patient, recommended further evaluation in the ED.  I suspect patient will need stat CT.  Patient is agreeable to same and will report to [facility], ED immediately after leaving this office.   Final Clinical Impressions(s) / UC Diagnoses   Final diagnoses:  Lower abdominal pain     Discharge Instructions      Report to ED immediately.      ED Prescriptions   None    PDMP not reviewed this encounter.   Tomi Bamberger, PA-C 07/04/21 [phone]

## 2021-07-04 NOTE — ED Triage Notes (Signed)
Pt reports waking this AM with fever up to 102 with chills, lower abd pain and back pain.  Denies dysuria.  Denies vaginal discharge. Has not taken any meds today.

## 2021-07-04 NOTE — Discharge Instructions (Addendum)
You were seen in the ED for evaluation of abdominal pain and fever. Your Cat scan was normal in the ER today and your symptoms are due to COVID-19. You are safe for discharge today and please take ibuprofen and tylenol for your pain. Return to the ED if you experience new or worsening chest pain, shortness of breath, persistent vomiting, or any other concerning symptoms.

## 2021-07-28 ENCOUNTER — Other Ambulatory Visit: Payer: Self-pay

## 2021-07-28 ENCOUNTER — Ambulatory Visit (HOSPITAL_COMMUNITY)
Admission: EM | Admit: 2021-07-28 | Discharge: 2021-07-28 | Disposition: A | Discharge status: Home / Self Care | Payer: Self-pay | Attending: Physician Assistant | Admitting: Physician Assistant

## 2021-07-28 ENCOUNTER — Encounter (HOSPITAL_COMMUNITY): Payer: Self-pay | Admitting: Emergency Medicine

## 2021-07-28 DIAGNOSIS — M546 Pain in thoracic spine: Secondary | ICD-10-CM

## 2021-07-28 DIAGNOSIS — M62838 Other muscle spasm: Secondary | ICD-10-CM

## 2021-07-28 MED ORDER — METHOCARBAMOL 500 MG PO TABS: 500.0000 mg | ORAL_TABLET | Freq: Three times a day (TID) | ORAL | 0 refills | Status: DC | PRN

## 2021-07-28 MED ORDER — IBUPROFEN 800 MG PO TABS: 800.0000 mg | ORAL_TABLET | Freq: Three times a day (TID) | ORAL | 0 refills | Status: DC | PRN

## 2021-07-28 NOTE — Discharge Instructions (Addendum)
I have called in Robaxin which is a muscle relaxer that you can take every 8 hours as needed.  This will make you sleepy so not drive or drink alcohol while taking it.  I have also called in ibuprofen 800 mg.  Do not take NSAIDs including aspirin, ibuprofen/Advil, naproxen/Aleve with this medication as it can cause stomach bleeding.  You can use Tylenol for breakthrough pain.  Continue with heat, rest, stretch for symptom relief.  If symptoms or not improving I do recommend reevaluation to consider referral to physical therapy or imaging.  If you have any sudden worsening of symptoms please return immediately for further evaluation.

## 2021-07-28 NOTE — ED Provider Notes (Signed)
[facility]    CSN: [id number] Arrival date & time: 07/28/21  0845      History   Chief Complaint Chief Complaint  Patient presents with   Spasms    HPI Darlene Gray is a 35 y.o. female.   Patient presents today with 3-day history of right thoracic back pain.  She denies any known injury or increase in activity prior to symptom onset.  Reports pain is rated 7 on a 0-10 pain scale, localized to affected area, described as tightness/aching, no aggravating or alleviating factors identified.  She has tried Epson salt bath, gentle stretch, heat, Advil with minimal improvement of symptoms.  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  She denies any additional symptoms including fever, urinary symptoms, pelvic pain, abdominal pain, nausea, vomiting.  She did have to leave work as result of symptoms and is requesting work excuse note today.  She denies any numbness or paresthesias.  She denies history of kidney stones.   Past Medical History:  Diagnosis Date   Asthma    Ectopic pregnancy     There are no problems to display for this patient.   Past Surgical History:  Procedure Laterality Date   SALPINGECTOMY      OB History     Gravida  2   Para  1   Term  1   Preterm      AB  1   Living  1      SAB      IAB      Ectopic  1   Multiple      Live Births  1            Home Medications    Prior to Admission medications   Medication Sig Start Date End Date Taking? Authorizing Provider  methocarbamol (ROBAXIN) 500 MG tablet Take 1 tablet (500 mg total) by mouth every 8 (eight) hours as needed for muscle spasms. 07/28/21  Yes Jinger Middlesworth K, PA-C  albuterol (VENTOLIN HFA) 108 (90 Base) MCG/ACT inhaler Inhale 1-2 puffs into the lungs every 6 (six) hours as needed for wheezing or shortness of breath. Patient not taking: Reported on 07/28/2021 06/17/21   Mardella Layman, MD   ibuprofen (ADVIL) 800 MG tablet Take 1 tablet (800 mg total) by mouth every 8 (eight) hours as needed. 07/28/21   Jara Feider, Noberto Retort, PA-C  nystatin-triamcinolone (MYCOLOG II) cream Apply to affected area daily Patient not taking: Reported on 07/28/2021 08/29/20   Janace Aris, NP    Family History Family History  Problem Relation Age of Onset   Healthy Mother    Diabetes Maternal Grandmother     Social History Social History   Tobacco Use   Smoking status: Every Day    Types: Cigarettes   Smokeless tobacco: Never  Vaping Use   Vaping Use: Never used  Substance Use Topics   Alcohol use: Yes    Comment: occasionally   Drug use: Never     Allergies   Shellfish allergy   Review of Systems Review of Systems  Constitutional:  Positive for activity change. Negative for appetite change, fatigue and fever.  Respiratory:  Negative for cough and shortness of breath.   Cardiovascular:  Negative for chest pain.  Gastrointestinal:  Negative for abdominal pain, diarrhea, nausea and vomiting.  Genitourinary:  Negative for dysuria, frequency, urgency, vaginal bleeding, vaginal discharge and vaginal pain.  Musculoskeletal:  Positive for back pain. Negative for arthralgias and myalgias.  Neurological:  Negative for dizziness, weakness, light-headedness, numbness and headaches.    Physical Exam Triage Vital Signs ED Triage Vitals  Enc Vitals Group     BP 07/28/21 1041 126/89     Pulse Rate 07/28/21 1041 88     Resp 07/28/21 1041 (!) 22     Temp 07/28/21 1041 98.1 F (36.7 C)     Temp Source 07/28/21 1041 Oral     SpO2 07/28/21 1041 97 %     Weight --      Height --      Head Circumference --      Peak Flow --      Pain Score 07/28/21 1037 7     Pain Loc --      Pain Edu? --      Excl. in GC? --    No data found.  Updated Vital Signs BP 126/89 (BP Location: Left Arm) Comment (BP Location): large cuff  Pulse 88   Temp 98.1 F (36.7 C) (Oral)   Resp (!) 22   LMP  07/06/2021   SpO2 97%   Visual Acuity Right Eye Distance:   Left Eye Distance:   Bilateral Distance:    Right Eye Near:   Left Eye Near:    Bilateral Near:     Physical Exam Vitals reviewed.  Constitutional:      General: She is awake. She is not in acute distress.    Appearance: Normal appearance. She is well-developed.     Comments: Very pleasant female appears stated age sitting on exam room table rubbing right flank and rocking in discomfort  HENT:     Head: Normocephalic and atraumatic.  Cardiovascular:     Rate and Rhythm: Normal rate and regular rhythm.     Heart sounds: Normal heart sounds, S1 normal and S2 normal. No murmur heard. Pulmonary:     Effort: Pulmonary effort is normal.     Breath sounds: Normal breath sounds. No wheezing, rhonchi or rales.     Comments: Clear to auscultation bilaterally Abdominal:     General: Bowel sounds are normal.     Palpations: Abdomen is soft.     Tenderness: There is no abdominal tenderness. There is no right CVA tenderness, left CVA tenderness, guarding or rebound.     Comments: Benign abdominal exam  Musculoskeletal:     Cervical back: No tenderness or bony tenderness.     Thoracic back: Spasms and tenderness present. No bony tenderness.     Lumbar back: No tenderness or bony tenderness.       Back:     Comments: Back: Spasm and tenderness palpation of right thoracic paraspinal muscles.  No pain percussion of vertebrae.  No deformity or step-off noted.  Normal active range of motion.  Psychiatric:        Behavior: Behavior is cooperative.     UC Treatments / Results  Labs (all labs ordered are listed, but only abnormal results are displayed) Labs Reviewed - No data to display  EKG   Radiology No results found.  Procedures Procedures (including critical care time)  Medications Ordered in UC Medications - No data to display  Initial Impression / Assessment and Plan / UC Course  I have reviewed the triage vital  signs and the nursing notes.  Pertinent labs & imaging results that were available during my care of the patient were reviewed by me and considered in my  medical decision making (see chart for details).      Initially ordered urine pregnancy and UA but patient declined these test today as she is confident it is related to musculoskeletal etiology.  Patient does have significant tenderness palpation on exam consistent with muscle spasm/musculoskeletal etiology.  She was given Robaxin to be taken up to 3 times a day as needed with instruction not to drive or drink alcohol with this medication as drowsiness is a common side effect.  She was prescribed ibuprofen 800 mg to be taken up to 3 times a day as needed with instruction not to take additional NSAIDs including aspirin, ibuprofen/Advil, naproxen/Aleve due to risk of stomach bleeding.  She can use Tylenol for breakthrough pain.  Recommended conservative treatment measures including heat, rest, gentle stretch.  Discussed that if she develops any additional symptoms including hematuria or urinary symptoms she needs to be reevaluated immediately.  Strict return precautions given to which she expressed understanding.  Final Clinical Impressions(s) / UC Diagnoses   Final diagnoses:  Acute right-sided thoracic back pain  Muscle spasm     Discharge Instructions      I have called in Robaxin which is a muscle relaxer that you can take every 8 hours as needed.  This will make you sleepy so not drive or drink alcohol while taking it.  I have also called in ibuprofen 800 mg.  Do not take NSAIDs including aspirin, ibuprofen/Advil, naproxen/Aleve with this medication as it can cause stomach bleeding.  You can use Tylenol for breakthrough pain.  Continue with heat, rest, stretch for symptom relief.  If symptoms or not improving I do recommend reevaluation to consider referral to physical therapy or imaging.  If you have any sudden worsening of symptoms please  return immediately for further evaluation.     ED Prescriptions     Medication Sig Dispense Auth. Provider   ibuprofen (ADVIL) 800 MG tablet Take 1 tablet (800 mg total) by mouth every 8 (eight) hours as needed. 21 tablet Takyia Sindt K, PA-C   methocarbamol (ROBAXIN) 500 MG tablet Take 1 tablet (500 mg total) by mouth every 8 (eight) hours as needed for muscle spasms. 21 tablet Sirinity Outland, Noberto Retort, PA-C      PDMP not reviewed this encounter.   Jeani Hawking, PA-C 07/28/21 1059

## 2021-07-28 NOTE — ED Notes (Signed)
No answer in lobby.

## 2021-07-28 NOTE — ED Triage Notes (Signed)
Spasms on right side of torso, started on Sunday.  Reports her job sent her home.  No nausea, no vomiting, no diarrhea

## 2022-01-13 ENCOUNTER — Other Ambulatory Visit: Payer: Self-pay

## 2022-01-13 ENCOUNTER — Ambulatory Visit
Admission: RE | Admit: 2022-01-13 | Discharge: 2022-01-13 | Disposition: A | Discharge status: Home / Self Care | Payer: Medicaid Other | Source: Ambulatory Visit | Attending: Internal Medicine | Admitting: Internal Medicine

## 2022-01-13 VITALS — BP 127/85 | HR 90 | Temp 98.6°F | Resp 20

## 2022-01-13 DIAGNOSIS — J069 Acute upper respiratory infection, unspecified: Secondary | ICD-10-CM | POA: Insufficient documentation

## 2022-01-13 DIAGNOSIS — J029 Acute pharyngitis, unspecified: Secondary | ICD-10-CM | POA: Insufficient documentation

## 2022-01-13 LAB — POCT RAPID STREP A (OFFICE): Rapid Strep A Screen: NEGATIVE

## 2022-01-13 MED ORDER — CETIRIZINE HCL 10 MG PO TABS: 10.0000 mg | ORAL_TABLET | Freq: Every day | ORAL | 0 refills | Status: DC

## 2022-01-13 MED ORDER — BENZONATATE 100 MG PO CAPS: 100.0000 mg | ORAL_CAPSULE | Freq: Three times a day (TID) | ORAL | 0 refills | Status: DC | PRN

## 2022-01-13 MED ORDER — FLUTICASONE PROPIONATE 50 MCG/ACT NA SUSP: 1.0000 | Freq: Every day | NASAL | 0 refills | Status: DC

## 2022-01-13 MED ORDER — ALBUTEROL SULFATE HFA 108 (90 BASE) MCG/ACT IN AERS: 1.0000 | INHALATION_SPRAY | Freq: Four times a day (QID) | RESPIRATORY_TRACT | 0 refills | Status: DC | PRN

## 2022-01-13 NOTE — ED Provider Notes (Signed)
?[name] ? ? ? ?CSN: [id number] ?Arrival date & time: 01/13/22  1408 ? ? ?  ? ?History   ?Chief Complaint ?Chief Complaint  ?Patient presents with  ? Cough  ?  A sore throat with a cold and fatigue - Entered by patient  ? Appointment  ? ? ?HPI ?Darlene Gray is a 36 y.o. female.  ? ?Patient presents with runny nose, cough, nasal congestion, sore throat, fatigue that has been present for approximately 3 days.  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  Denies any known fevers at home.  Denies chest pain, shortness of breath, ear pain, nausea, vomiting, diarrhea, abdominal pain.  Patient does have history of asthma and reports that she has had one episode of wheezing. ? ? ?Cough ? ?Past Medical History:  ?Diagnosis Date  ? Asthma   ? Ectopic pregnancy   ? ? ?There are no problems to display for this patient. ? ? ?Past Surgical History:  ?Procedure Laterality Date  ? SALPINGECTOMY    ? ? ?OB History   ? ? Gravida  ?2  ? Para  ?1  ? Term  ?1  ? Preterm  ?   ? AB  ?1  ? Living  ?1  ?  ? ? SAB  ?   ? IAB  ?   ? Ectopic  ?1  ? Multiple  ?   ? Live Births  ?1  ?   ?  ?  ? ? ? ?Home Medications   ? ?Prior to Admission medications   ?Medication Sig Start Date End Date Taking? Authorizing Provider  ?benzonatate (TESSALON) 100 MG capsule Take 1 capsule (100 mg total) by mouth every 8 (eight) hours as needed for cough. 01/13/22  Yes Gustavus BryantMound, Jacklynn Dehaas E, FNP  ?cetirizine (ZYRTEC) 10 MG tablet Take 1 tablet (10 mg total) by mouth daily. 01/13/22  Yes Gustavus BryantMound, Terryl Molinelli E, FNP  ?fluticasone (FLONASE) 50 MCG/ACT nasal spray Place 1 spray into both nostrils daily for 3 days. 01/13/22 01/16/22 Yes Sarh Kirschenbaum, Acie FredricksonHaley E, FNP  ?albuterol (VENTOLIN HFA) 108 (90 Base) MCG/ACT inhaler Inhale 1-2 puffs into the lungs every 6 (six) hours as needed for wheezing or shortness of breath. 01/13/22   Gustavus BryantMound, Marqui Formby E, FNP  ?ibuprofen (ADVIL) 800 MG tablet Take 1 tablet (800 mg total) by mouth every  8 (eight) hours as needed. 07/28/21   Raspet, Noberto RetortErin K, PA-C  ?methocarbamol (ROBAXIN) 500 MG tablet Take 1 tablet (500 mg total) by mouth every 8 (eight) hours as needed for muscle spasms. 07/28/21   Raspet, Noberto RetortErin K, PA-C  ?nystatin-triamcinolone (MYCOLOG II) cream Apply to affected area daily ?Patient not taking: Reported on 07/28/2021 08/29/20   Janace ArisBast, Traci A, NP  ? ? ?Family History ?Family History  ?Problem Relation Age of Onset  ? Healthy Mother   ? Diabetes Maternal Grandmother   ? ? ?Social History ?Social History  ? ?Tobacco Use  ? Smoking status: Every Day  ?  Types: Cigarettes  ? Smokeless tobacco: Never  ?Vaping Use  ? Vaping Use: Never used  ?Substance Use Topics  ? Alcohol use: Yes  ?  Comment: occasionally  ? Drug use: Never  ? ? ? ?Allergies   ?Shellfish allergy ? ? ?Review of Systems ?Review of Systems ?Per HPI ? ?Physical Exam ?Triage Vital Signs ?ED Triage Vitals  ?Enc Vitals Group  ?   BP 01/13/22 1420 127/85  ?   Pulse  Rate 01/13/22 1420 90  ?   Resp 01/13/22 1420 20  ?   Temp 01/13/22 1420 98.6 ?F (37 ?C)  ?   Temp Source 01/13/22 1420 Oral  ?   SpO2 01/13/22 1420 96 %  ?   Weight --   ?   Height --   ?   Head Circumference --   ?   Peak Flow --   ?   Pain Score 01/13/22 1418 0  ?   Pain Loc --   ?   Pain Edu? --   ?   Excl. in GC? --   ? ?No data found. ? ?Updated Vital Signs ?BP 127/85 (BP Location: Left Arm) Comment (BP Location): large cuff  Pulse 90   Temp 98.6 ?F (37 ?C) (Oral)   Resp 20   LMP 01/05/2022   SpO2 96%  ? ?Visual Acuity ?Right Eye Distance:   ?Left Eye Distance:   ?Bilateral Distance:   ? ?Right Eye Near:   ?Left Eye Near:    ?Bilateral Near:    ? ?Physical Exam ?Constitutional:   ?   General: She is not in acute distress. ?   Appearance: Normal appearance. She is not toxic-appearing or diaphoretic.  ?HENT:  ?   Head: Normocephalic and atraumatic.  ?   Right Ear: Tympanic membrane and ear canal normal.  ?   Left Ear: Tympanic membrane and ear canal normal.  ?   Nose:  Congestion present.  ?   Mouth/Throat:  ?   Mouth: Mucous membranes are moist.  ?   Pharynx: Posterior oropharyngeal erythema present.  ?Eyes:  ?   Extraocular Movements: Extraocular movements intact.  ?   Conjunctiva/sclera: Conjunctivae normal.  ?   Pupils: Pupils are equal, round, and reactive to light.  ?Cardiovascular:  ?   Rate and Rhythm: Normal rate and regular rhythm.  ?   Pulses: Normal pulses.  ?   Heart sounds: Normal heart sounds.  ?Pulmonary:  ?   Effort: Pulmonary effort is normal. No respiratory distress.  ?   Breath sounds: Normal breath sounds. No stridor. No wheezing, rhonchi or rales.  ?Abdominal:  ?   General: Abdomen is flat. Bowel sounds are normal.  ?   Palpations: Abdomen is soft.  ?Musculoskeletal:     ?   General: Normal range of motion.  ?   Cervical back: Normal range of motion.  ?Skin: ?   General: Skin is warm and dry.  ?Neurological:  ?   General: No focal deficit present.  ?   Mental Status: She is alert and oriented to person, place, and time. Mental status is at baseline.  ?Psychiatric:     ?   Mood and Affect: Mood normal.     ?   Behavior: Behavior normal.  ? ? ? ?UC Treatments / Results  ?Labs ?(all labs ordered are listed, but only abnormal results are displayed) ?Labs Reviewed  ?CULTURE, GROUP A STREP [facility])  ?COVID-19, FLU A+B NAA  ?POCT RAPID STREP A (OFFICE)  ? ? ?EKG ? ? ?Radiology ?No results found. ? ?Procedures ?Procedures (including critical care time) ? ?Medications Ordered in UC ?Medications - No data to display ? ?Initial Impression / Assessment and Plan / UC Course  ?I have reviewed the triage vital signs and the nursing notes. ? ?Pertinent labs & imaging results that were available during my care of the patient were reviewed by me and considered in my medical decision making (see chart for details). ? ?  ? ?  Patient presents with symptoms likely from a viral upper respiratory infection. Differential includes bacterial pneumonia, sinusitis, allergic rhinitis,  COVID-19, flu. Do not suspect underlying cardiopulmonary process. Symptoms seem unlikely related to ACS, CHF or COPD exacerbations, pneumonia, pneumothorax. Patient is nontoxic appearing and not in need of emergent medical intervention.  Pressure was negative.  Throat culture, COVID-19, flu test pending. ? ?Recommended symptom control with over the counter medications: Daily oral anti-histamine, Oral decongestant or IN corticosteroid, saline irrigations, cepacol lozenges, Robitussin, Delsym, honey tea.  Patient sent precautions. ? ?Lung sounds are clear so do not think that prednisone is needed at this time. ? ?Return if symptoms fail to improve in 1-2 weeks or you develop shortness of breath, chest pain, severe headache. Patient states understanding and is agreeable. ? ?Discharged with PCP followup.  ?Final Clinical Impressions(s) / UC Diagnoses  ? ?Final diagnoses:  ?Viral upper respiratory tract infection with cough  ?Sore throat  ? ? ? ?Discharge Instructions   ? ?  ?Strep was negative.  COVID test and flu test are pending.  We will call if they are positive.  It appears that you have a viral upper respiratory infection that should run its course and self resolve in the next few days.  You have been prescribed medications to help alleviate symptoms ? ? ? ? ?ED Prescriptions   ? ? Medication Sig Dispense Auth. Provider  ? albuterol (VENTOLIN HFA) 108 (90 Base) MCG/ACT inhaler Inhale 1-2 puffs into the lungs every 6 (six) hours as needed for wheezing or shortness of breath. 1 each Gustavus Bryant, FNP  ? fluticasone (FLONASE) 50 MCG/ACT nasal spray Place 1 spray into both nostrils daily for 3 days. 16 g Gustavus Bryant, [place]  ? cetirizine (ZYRTEC) 10 MG tablet Take 1 tablet (10 mg total) by mouth daily. 30 tablet [place], [place] E, [place]  ? benzonatate (TESSALON) 100 MG capsule Take 1 capsule (100 mg total) by mouth every 8 (eight) hours as needed for cough. 21 capsule Ervin Knack E, [place]  ? ?  ? ?PDMP not reviewed this  encounter. ?  ?Gustavus Bryant, [place] ?01/13/22 1505 ? ?

## 2022-01-13 NOTE — Discharge Instructions (Signed)
Strep was negative.  COVID test and flu test are pending.  We will call if they are positive.  It appears that you have a viral upper respiratory infection that should run its course and self resolve in the next few days.  You have been prescribed medications to help alleviate symptoms ?

## 2022-01-13 NOTE — ED Triage Notes (Signed)
Patient reports runny nose, cough, stuffiness, and decreased voice, sore throat and fatigue.  Patient needs a work note.   ?

## 2022-01-14 LAB — COVID-19, FLU A+B NAA
Influenza A, NAA: NOT DETECTED
Influenza B, NAA: NOT DETECTED
SARS-CoV-2, NAA: NOT DETECTED

## 2022-01-15 LAB — CULTURE, GROUP A STREP (THRC)

## 2022-02-05 ENCOUNTER — Emergency Department (HOSPITAL_COMMUNITY)
Admission: EM | Admit: 2022-02-05 | Discharge: 2022-02-06 | Disposition: A | Discharge status: Home / Self Care | Payer: No Typology Code available for payment source | Attending: Emergency Medicine | Admitting: Emergency Medicine

## 2022-02-05 ENCOUNTER — Emergency Department (HOSPITAL_COMMUNITY): Payer: No Typology Code available for payment source

## 2022-02-05 ENCOUNTER — Encounter (HOSPITAL_COMMUNITY): Payer: Self-pay | Admitting: Emergency Medicine

## 2022-02-05 ENCOUNTER — Other Ambulatory Visit: Payer: Self-pay

## 2022-02-05 DIAGNOSIS — S79922A Unspecified injury of left thigh, initial encounter: Secondary | ICD-10-CM | POA: Diagnosis present

## 2022-02-05 DIAGNOSIS — S7012XA Contusion of left thigh, initial encounter: Secondary | ICD-10-CM | POA: Diagnosis not present

## 2022-02-05 DIAGNOSIS — Y92410 Unspecified street and highway as the place of occurrence of the external cause: Secondary | ICD-10-CM | POA: Diagnosis not present

## 2022-02-05 DIAGNOSIS — Z23 Encounter for immunization: Secondary | ICD-10-CM | POA: Diagnosis not present

## 2022-02-05 DIAGNOSIS — M79622 Pain in left upper arm: Secondary | ICD-10-CM | POA: Insufficient documentation

## 2022-02-05 DIAGNOSIS — R519 Headache, unspecified: Secondary | ICD-10-CM | POA: Diagnosis not present

## 2022-02-05 MED ORDER — HYDROMORPHONE HCL 1 MG/ML IJ SOLN
1.0000 mg | Freq: Once | INTRAMUSCULAR | Status: AC
  Administered 2022-02-05: 1 mg via INTRAVENOUS
  Filled 2022-02-05: qty 1

## 2022-02-05 MED ORDER — KETOROLAC TROMETHAMINE 30 MG/ML IJ SOLN
15.0000 mg | Freq: Once | INTRAMUSCULAR | Status: AC
  Administered 2022-02-05: 15 mg via INTRAVENOUS
  Filled 2022-02-05: qty 1

## 2022-02-05 MED ORDER — ONDANSETRON HCL 4 MG/2ML IJ SOLN
4.0000 mg | Freq: Once | INTRAMUSCULAR | Status: AC
  Administered 2022-02-05: 4 mg via INTRAVENOUS
  Filled 2022-02-05: qty 2

## 2022-02-05 MED ORDER — TETANUS-DIPHTH-ACELL PERTUSSIS 5-2.5-18.5 LF-MCG/0.5 IM SUSY
0.5000 mL | PREFILLED_SYRINGE | Freq: Once | INTRAMUSCULAR | Status: AC
  Administered 2022-02-05: 0.5 mL via INTRAMUSCULAR
  Filled 2022-02-05: qty 0.5
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
## 2022-02-06 MED ORDER — METHOCARBAMOL 500 MG PO TABS
500.0000 mg | ORAL_TABLET | Freq: Three times a day (TID) | ORAL | 0 refills | Status: DC | PRN
Start: 2022-02-06 — End: 2023-02-20

## 2022-02-06 MED ORDER — IBUPROFEN 800 MG PO TABS: 800.0000 mg | ORAL_TABLET | Freq: Four times a day (QID) | ORAL | 0 refills | Status: DC | PRN

## 2022-02-06 NOTE — Progress Notes (Signed)
Orthopedic Tech Progress Note ?Patient Details:  ?Darlene Gray ?November 11, 1985 ?[id number] ? ?Ortho Devices ?Type of Ortho Device: Ace wrap, Crutches ?Ortho Device/Splint Location: lle thigh ace wrap ?Ortho Device/Splint Interventions: Ordered, Application, Adjustment ?  ?Post Interventions ?Patient Tolerated: Well ?Instructions Provided: Care of device, Adjustment of device ? ?Trinna Post ?02/06/2022, 1:07 AM ? ?

## 2022-02-06 NOTE — ED Notes (Signed)
RN called orthopedic technician for pt's ace wrap and crutches ?
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
## 2022-02-07 ENCOUNTER — Ambulatory Visit
Admission: EM | Admit: 2022-02-07 | Discharge: 2022-02-07 | Disposition: A | Discharge status: Home / Self Care | Payer: Medicaid Other | Attending: Urgent Care | Admitting: Urgent Care

## 2022-02-07 DIAGNOSIS — M2550 Pain in unspecified joint: Secondary | ICD-10-CM

## 2022-02-07 DIAGNOSIS — R52 Pain, unspecified: Secondary | ICD-10-CM

## 2022-02-07 DIAGNOSIS — S40812D Abrasion of left upper arm, subsequent encounter: Secondary | ICD-10-CM

## 2022-02-07 MED ORDER — BACITRACIN ZINC 500 UNIT/GM EX OINT: 1.0000 "application " | TOPICAL_OINTMENT | Freq: Two times a day (BID) | CUTANEOUS | 0 refills | Status: DC

## 2022-02-07 MED ORDER — KETOROLAC TROMETHAMINE 60 MG/2ML IM SOLN
60.0000 mg | Freq: Once | INTRAMUSCULAR | Status: AC
  Administered 2022-02-07: 60 mg via INTRAMUSCULAR

## 2022-02-07 MED ORDER — NAPROXEN 500 MG PO TABS: 500.0000 mg | ORAL_TABLET | Freq: Two times a day (BID) | ORAL | 0 refills | Status: DC

## 2022-02-07 MED ORDER — TIZANIDINE HCL 4 MG PO TABS: 4.0000 mg | ORAL_TABLET | Freq: Every day | ORAL | 0 refills | Status: DC

## 2022-02-07 NOTE — Discharge Instructions (Addendum)
Change your dressing 2-3 times daily. Every time you change your dressing, clean the wound gently with warm water and Dial antibacterial soap. Pat the wound dry, let it breathe for roughly an hour before covering it back up. When you reapply a dressing, use non-stick/non-adherent gauze. Apply Bacitracin ointment to the wound. After 4-7 days, the wound should scab over nicely and then you don't have to continue doing dressings.  ?

## 2022-02-07 NOTE — ED Notes (Signed)
Patient called back in because she states that she did not receive a copy of her prescriptions when she left and can not get it filled by the pharmacy. The medication is Methocarbamol, reached out to the ED pharmacy and they explained that Dr. Blinda Leatherwood would need to resend the medication or another doctor would have to give authorization to have it resent, they would not be able to reprint the medication for the patient.  ?
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
## 2022-05-13 ENCOUNTER — Ambulatory Visit
Admission: RE | Admit: 2022-05-13 | Discharge: 2022-05-13 | Disposition: A | Discharge status: Home / Self Care | Payer: No Typology Code available for payment source | Source: Ambulatory Visit | Attending: Physician Assistant | Admitting: Physician Assistant

## 2022-05-13 VITALS — BP 123/88 | HR 83 | Temp 98.1°F | Resp 17

## 2022-05-13 DIAGNOSIS — J069 Acute upper respiratory infection, unspecified: Secondary | ICD-10-CM

## 2022-05-13 DIAGNOSIS — Z8709 Personal history of other diseases of the respiratory system: Secondary | ICD-10-CM

## 2022-05-13 MED ORDER — PROMETHAZINE-DM 6.25-15 MG/5ML PO SYRP: 5.0000 mL | ORAL_SOLUTION | Freq: Four times a day (QID) | ORAL | 0 refills | Status: DC | PRN

## 2022-05-13 MED ORDER — ALBUTEROL SULFATE HFA 108 (90 BASE) MCG/ACT IN AERS: 1.0000 | INHALATION_SPRAY | Freq: Four times a day (QID) | RESPIRATORY_TRACT | 1 refills | Status: DC | PRN

## 2022-05-13 NOTE — ED Provider Notes (Signed)
[name]    CSN: [id number] Arrival date & time: 05/13/22  1038      History   Chief Complaint Chief Complaint  Patient presents with   Nasal Congestion    Cold - Entered by patient   Facial Pain   Headache    HPI Darlene Gray is a 36 y.o. female.   Patient presents today with a 3-day history of URI symptoms.  Reports sinus pressure, nasal congestion, cough, fatigue, malaise.  Denies any chest pain, shortness of breath, nausea, vomiting.  She has tried NyQuil with temporary improvement of symptoms.  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  She has had COVID with last episode approximately 8 months ago.  She has not had COVID-19 vaccine.  She does have allergies and asthma but has not required her albuterol inhaler more frequently.  She does not have this available and is interested in a refill if this can be made affordable.  Denies any recent antibiotic or steroid use.  Patient is a current everyday smoker smoking her typical amount.  She is having difficulty with daily activities as result of symptoms.    Past Medical History:  Diagnosis Date   Asthma    Ectopic pregnancy     There are no problems to display for this patient.   Past Surgical History:  Procedure Laterality Date   SALPINGECTOMY      OB History     Gravida  2   Para  1   Term  1   Preterm      AB  1   Living  1      SAB      IAB      Ectopic  1   Multiple      Live Births  1            Home Medications    Prior to Admission medications   Medication Sig Start Date End Date Taking? Authorizing Provider  promethazine-dextromethorphan (PROMETHAZINE-DM) 6.25-15 MG/5ML syrup Take 5 mLs by mouth 4 (four) times daily as needed for cough. 05/13/22  Yes Ameliarose Shark K, PA-C  albuterol (VENTOLIN HFA) 108 (90 Base) MCG/ACT inhaler Inhale 1-2 puffs into the lungs every 6 (six) hours as needed for wheezing or  shortness of breath. 05/13/22   Emaley Applin K, PA-C  bacitracin ointment Apply 1 application. topically 2 (two) times daily. 02/07/22   Wallis Bamberg, PA-C  benzonatate (TESSALON) 100 MG capsule Take 1 capsule (100 mg total) by mouth every 8 (eight) hours as needed for cough. 01/13/22   Gustavus Bryant, FNP  cetirizine (ZYRTEC) 10 MG tablet Take 1 tablet (10 mg total) by mouth daily. 01/13/22   Gustavus Bryant, FNP  fluticasone (FLONASE) 50 MCG/ACT nasal spray Place 1 spray into both nostrils daily for 3 days. 01/13/22 01/16/22  Gustavus Bryant, FNP  ibuprofen (ADVIL) 800 MG tablet Take 1 tablet (800 mg total) by mouth every 6 (six) hours as needed for moderate pain. 02/06/22   Gilda Crease, MD  methocarbamol (ROBAXIN) 500 MG tablet Take 1 tablet (500 mg total) by mouth every 8 (eight) hours as needed for muscle spasms. 02/06/22   Gilda Crease, MD  naproxen (NAPROSYN) 500 MG tablet Take 1 tablet (500 mg total) by mouth 2 (two) times daily with a meal. 02/07/22   Wallis Bamberg, PA-C  nystatin-triamcinolone (MYCOLOG II) cream Apply to affected area  daily Patient not taking: Reported on 07/28/2021 08/29/20   Dahlia Byes A, NP  tiZANidine (ZANAFLEX) 4 MG tablet Take 1 tablet (4 mg total) by mouth at bedtime. 02/07/22   Wallis Bamberg, PA-C    Family History Family History  Problem Relation Age of Onset   Healthy Mother    Diabetes Maternal Grandmother     Social History Social History   Tobacco Use   Smoking status: Every Day    Packs/day: 0.50    Types: Cigarettes   Smokeless tobacco: Never  Vaping Use   Vaping Use: Never used  Substance Use Topics   Alcohol use: Yes    Comment: occasionally   Drug use: Yes    Types: Marijuana     Allergies   Shellfish allergy   Review of Systems Review of Systems  Constitutional:  Positive for activity change and fatigue. Negative for appetite change and fever.  HENT:  Positive for congestion and sinus pressure. Negative for sneezing and  sore throat.   Respiratory:  Positive for cough. Negative for shortness of breath.   Cardiovascular:  Negative for chest pain.  Gastrointestinal:  Negative for abdominal pain, diarrhea, nausea and vomiting.  Neurological:  Positive for headaches. Negative for dizziness and light-headedness.     Physical Exam Triage Vital Signs ED Triage Vitals [05/13/22 1056]  Enc Vitals Group     BP 123/88     Pulse Rate 83     Resp 17     Temp 98.1 F (36.7 C)     Temp src      SpO2 98 %     Weight      Height      Head Circumference      Peak Flow      Pain Score 0     Pain Loc      Pain Edu?      Excl. in GC?    No data found.  Updated Vital Signs BP 123/88   Pulse 83   Temp 98.1 F (36.7 C)   Resp 17   SpO2 98%   Visual Acuity Right Eye Distance:   Left Eye Distance:   Bilateral Distance:    Right Eye Near:   Left Eye Near:    Bilateral Near:     Physical Exam Vitals reviewed.  Constitutional:      General: She is awake. She is not in acute distress.    Appearance: Normal appearance. She is well-developed. She is not ill-appearing.     Comments: Very pleasant female appears stated age in no acute distress sitting comfortably in exam room  HENT:     Head: Normocephalic and atraumatic.     Right Ear: Tympanic membrane, ear canal and external ear normal. Tympanic membrane is not erythematous or bulging.     Left Ear: Tympanic membrane, ear canal and external ear normal. Tympanic membrane is not erythematous or bulging.     Nose:     Right Sinus: Maxillary sinus tenderness present. No frontal sinus tenderness.     Left Sinus: Maxillary sinus tenderness present. No frontal sinus tenderness.     Mouth/Throat:     Pharynx: Uvula midline. Posterior oropharyngeal erythema present. No oropharyngeal exudate.  Cardiovascular:     Rate and Rhythm: Normal rate and regular rhythm.     Heart sounds: Normal heart sounds, S1 normal and S2 normal. No murmur heard. Pulmonary:      Effort: Pulmonary effort is normal.  Breath sounds: Normal breath sounds. No wheezing, rhonchi or rales.     Comments: Clear to auscultation bilaterally Psychiatric:        Behavior: Behavior is cooperative.      UC Treatments / Results  Labs (all labs ordered are listed, but only abnormal results are displayed) Labs Reviewed - No data to display  EKG   Radiology No results found.  Procedures Procedures (including critical care time)  Medications Ordered in UC Medications - No data to display  Initial Impression / Assessment and Plan / UC Course  I have reviewed the triage vital signs and the nursing notes.  Pertinent labs & imaging results that were available during my care of the patient were reviewed by me and considered in my medical decision making (see chart for details).     Viral testing was deferred as patient has been symptomatic for several days and by the time we received results it would be too late to start antiviral therapy so this would not change management.  No evidence of acute infection on physical exam that would warrant initiation of antibiotics.  Patient was encouraged to use over-the-counter medications including Mucinex, Flonase, Tylenol for additional symptom relief.  She was prescribed mepazine DM for cough with instruction not to drive or drink alcohol while taking this medication.  Refill of albuterol was sent to pharmacy.  Recommended that patient use good Rx to obtain these at a reasonable cost and instructed her how to navigate the website.  She is to rest and drink plenty of fluid.  Discussed that if her symptoms do not improve by next week she needs to return for reevaluation.  If she has any worsening symptoms including chest pain, shortness of breath, nausea, vomiting, fever she needs to be seen immediately.  Strict return precautions given.  Work excuse note provided.  Final Clinical Impressions(s) / UC Diagnoses   Final diagnoses:  Upper  respiratory tract infection, unspecified type  History of asthma     Discharge Instructions      I believe that you have an upper respiratory infection.  This is likely caused by a virus.  It will take some time to get better on its own.  Make sure that you are using over-the-counter medications including Flonase, Mucinex, Tylenol and drinking plenty of fluid.  I have called in Promethazine DM for cough.  This will make you sleepy so do not drive or drink alcohol with taking it.  I have sent in a refill of your albuterol inhaler.  If your symptoms are not improving by next week please return for reevaluation.  If anything worsens and you have high fever, chest pain, shortness of breath, worsening cough, weakness, nausea, vomiting you need to be seen immediately.     ED Prescriptions     Medication Sig Dispense Auth. Provider   albuterol (VENTOLIN HFA) 108 (90 Base) MCG/ACT inhaler Inhale 1-2 puffs into the lungs every 6 (six) hours as needed for wheezing or shortness of breath. 1 each Desiree Fleming K, PA-C   promethazine-dextromethorphan (PROMETHAZINE-DM) 6.25-15 MG/5ML syrup Take 5 mLs by mouth 4 (four) times daily as needed for cough. 118 mL Braxtin Bamba K, PA-C      PDMP not reviewed this encounter.   Jeani Hawking, PA-C 05/13/22 1111

## 2022-05-13 NOTE — ED Triage Notes (Signed)
Pt is present today with c/o nasal congestion, HA, and sinus pressure. Pt sx started x3 days ago

## 2022-05-13 NOTE — Discharge Instructions (Addendum)
I believe that you have an upper respiratory infection.  This is likely caused by a virus.  It will take some time to get better on its own.  Make sure that you are using over-the-counter medications including Flonase, Mucinex, Tylenol and drinking plenty of fluid.  I have called in Promethazine DM for cough.  This will make you sleepy so do not drive or drink alcohol with taking it.  I have sent in a refill of your albuterol inhaler.  If your symptoms are not improving by next week please return for reevaluation.  If anything worsens and you have high fever, chest pain, shortness of breath, worsening cough, weakness, nausea, vomiting you need to be seen immediately.

## 2022-09-19 ENCOUNTER — Ambulatory Visit
Admission: EM | Admit: 2022-09-19 | Discharge: 2022-09-19 | Disposition: A | Discharge status: Home / Self Care | Payer: No Typology Code available for payment source | Attending: Physician Assistant | Admitting: Physician Assistant

## 2022-09-19 DIAGNOSIS — J0110 Acute frontal sinusitis, unspecified: Secondary | ICD-10-CM

## 2022-09-19 DIAGNOSIS — R051 Acute cough: Secondary | ICD-10-CM

## 2022-09-19 MED ORDER — ALBUTEROL SULFATE HFA 108 (90 BASE) MCG/ACT IN AERS
1.0000 | INHALATION_SPRAY | Freq: Once | RESPIRATORY_TRACT | Status: AC
  Administered 2022-09-19: 2 via RESPIRATORY_TRACT

## 2022-09-19 MED ORDER — AMOXICILLIN-POT CLAVULANATE 875-125 MG PO TABS: 1.0000 | ORAL_TABLET | Freq: Two times a day (BID) | ORAL | 0 refills | Status: DC

## 2022-09-19 NOTE — Discharge Instructions (Addendum)
Take antibiotic as prescribed Use inhaler as needed Drink plenty of fluids Can take Mucinex, use Flonase, and take Zyrtec allergy medication Return if you develop new or worsening symptoms.

## 2022-09-19 NOTE — ED Triage Notes (Signed)
Pt presents with productive with colored mucous and sore throat for over a week.

## 2022-10-01 NOTE — ED Provider Notes (Signed)
[facility]    CSN: [id number] Arrival date & time: 09/19/22  1615      History   Chief Complaint Chief Complaint  Patient presents with   URI    HPI Darlene Gray is a 36 y.o. female.   Pt presents with productive cough, sinus pressure/pain and sore throat that started about one week ago.  She denies fever, chills, n/v/d.  She has tried otc cold and cough medications with no relief.  Denies known sick contacts.     Past Medical History:  Diagnosis Date   Asthma    Ectopic pregnancy     There are no problems to display for this patient.   Past Surgical History:  Procedure Laterality Date   SALPINGECTOMY      OB History     Gravida  2   Para  1   Term  1   Preterm      AB  1   Living  1      SAB      IAB      Ectopic  1   Multiple      Live Births  1            Home Medications    Prior to Admission medications   Medication Sig Start Date End Date Taking? Authorizing Provider  amoxicillin-clavulanate (AUGMENTIN) 875-125 MG tablet Take 1 tablet by mouth every 12 (twelve) hours. 09/19/22  Yes Ward, Tylene Fantasia, PA-C  albuterol (VENTOLIN HFA) 108 (90 Base) MCG/ACT inhaler Inhale 1-2 puffs into the lungs every 6 (six) hours as needed for wheezing or shortness of breath. 05/13/22   Raspet, Erin K, PA-C  bacitracin ointment Apply 1 application. topically 2 (two) times daily. 02/07/22   Wallis Bamberg, PA-C  benzonatate (TESSALON) 100 MG capsule Take 1 capsule (100 mg total) by mouth every 8 (eight) hours as needed for cough. 01/13/22   Gustavus Bryant, FNP  cetirizine (ZYRTEC) 10 MG tablet Take 1 tablet (10 mg total) by mouth daily. 01/13/22   Gustavus Bryant, FNP  fluticasone (FLONASE) 50 MCG/ACT nasal spray Place 1 spray into both nostrils daily for 3 days. 01/13/22 01/16/22  Gustavus Bryant, FNP  ibuprofen (ADVIL) 800 MG tablet Take 1 tablet (800 mg total) by mouth every 6 (six) hours as needed for moderate pain. 02/06/22   Gilda Crease, MD  methocarbamol (ROBAXIN) 500 MG tablet Take 1 tablet (500 mg total) by mouth every 8 (eight) hours as needed for muscle spasms. 02/06/22   Gilda Crease, MD  naproxen (NAPROSYN) 500 MG tablet Take 1 tablet (500 mg total) by mouth 2 (two) times daily with a meal. 02/07/22   Wallis Bamberg, PA-C  nystatin-triamcinolone (MYCOLOG II) cream Apply to affected area daily Patient not taking: Reported on 07/28/2021 08/29/20   Dahlia Byes A, NP  promethazine-dextromethorphan (PROMETHAZINE-DM) 6.25-15 MG/5ML syrup Take 5 mLs by mouth 4 (four) times daily as needed for cough. 05/13/22   Raspet, Noberto Retort, PA-C  tiZANidine (ZANAFLEX) 4 MG tablet Take 1 tablet (4 mg total) by mouth at bedtime. 02/07/22   Wallis Bamberg, PA-C    Family History Family History  Problem Relation Age of Onset   Healthy Mother    Diabetes Maternal Grandmother     Social History Social History   Tobacco Use   Smoking status: Every Day    Packs/day: 0.50    Types: Cigarettes   Smokeless tobacco: Never  Vaping Use  Vaping Use: Never used  Substance Use Topics   Alcohol use: Yes    Comment: occasionally   Drug use: Yes    Types: Marijuana     Allergies   Shellfish allergy   Review of Systems Review of Systems  Constitutional:  Negative for chills and fever.  HENT:  Positive for congestion, sinus pressure and sore throat. Negative for ear pain.   Eyes:  Negative for pain and visual disturbance.  Respiratory:  Positive for cough. Negative for shortness of breath.   Cardiovascular:  Negative for chest pain and palpitations.  Gastrointestinal:  Negative for abdominal pain and vomiting.  Genitourinary:  Negative for dysuria and hematuria.  Musculoskeletal:  Negative for arthralgias and back pain.  Skin:  Negative for color change and rash.  Neurological:  Negative for seizures and syncope.  All other systems reviewed and are negative.    Physical Exam Triage Vital Signs ED Triage Vitals  Enc Vitals  Group     BP 09/19/22 1815 (!) 135/98     Pulse Rate 09/19/22 1815 76     Resp 09/19/22 1815 18     Temp 09/19/22 1815 98.4 F (36.9 C)     Temp Source 09/19/22 1815 Oral     SpO2 09/19/22 1815 98 %     Weight --      Height --      Head Circumference --      Peak Flow --      Pain Score 09/19/22 1813 5     Pain Loc --      Pain Edu? --      Excl. in GC? --    No data found.  Updated Vital Signs BP (!) 135/98 (BP Location: Left Arm)   Pulse 76   Temp 98.4 F (36.9 C) (Oral)   Resp 18   LMP 09/01/2022   SpO2 98%   Visual Acuity Right Eye Distance:   Left Eye Distance:   Bilateral Distance:    Right Eye Near:   Left Eye Near:    Bilateral Near:     Physical Exam Vitals and nursing note reviewed.  Constitutional:      General: She is not in acute distress.    Appearance: She is well-developed.  HENT:     Head: Normocephalic and atraumatic.  Eyes:     Conjunctiva/sclera: Conjunctivae normal.  Cardiovascular:     Rate and Rhythm: Normal rate and regular rhythm.     Heart sounds: No murmur heard. Pulmonary:     Effort: Pulmonary effort is normal. No respiratory distress.     Breath sounds: Normal breath sounds.  Abdominal:     Palpations: Abdomen is soft.     Tenderness: There is no abdominal tenderness.  Musculoskeletal:        General: No swelling.     Cervical back: Neck supple.  Skin:    General: Skin is warm and dry.     Capillary Refill: Capillary refill takes less than 2 seconds.  Neurological:     Mental Status: She is alert.  Psychiatric:        Mood and Affect: Mood normal.      UC Treatments / Results  Labs (all labs ordered are listed, but only abnormal results are displayed) Labs Reviewed - No data to display  EKG   Radiology No results found.  Procedures Procedures (including critical care time)  Medications Ordered in UC Medications  albuterol (VENTOLIN HFA) 108 (90 Base) MCG/ACT  inhaler 1-2 puff (2 puffs Inhalation  Provided for home use 09/19/22 1830)    Initial Impression / Assessment and Plan / UC Course  I have reviewed the triage vital signs and the nursing notes.  Pertinent labs & imaging results that were available during my care of the patient were reviewed by me and considered in my medical decision making (see chart for details).     Acute sinusitis, antibiotic prescribed.  Supportive care discussed.  Return precautions discussed.  Final Clinical Impressions(s) / UC Diagnoses   Final diagnoses:  Acute non-recurrent frontal sinusitis  Acute cough     Discharge Instructions      Take antibiotic as prescribed Use inhaler as needed Drink plenty of fluids Can take Mucinex, use Flonase, and take Zyrtec allergy medication Return if you develop new or worsening symptoms.    ED Prescriptions     Medication Sig Dispense Auth. Provider   amoxicillin-clavulanate (AUGMENTIN) 875-125 MG tablet Take 1 tablet by mouth every 12 (twelve) hours. 14 tablet Ward, Tylene Fantasia, PA-C      PDMP not reviewed this encounter.   Ward, Tylene Fantasia, PA-C 10/01/22 1005

## 2022-10-31 ENCOUNTER — Ambulatory Visit
Admission: RE | Admit: 2022-10-31 | Discharge: 2022-10-31 | Disposition: A | Discharge status: Home / Self Care | Payer: No Typology Code available for payment source | Source: Ambulatory Visit | Attending: Physician Assistant | Admitting: Physician Assistant

## 2022-10-31 VITALS — BP 119/87 | HR 110 | Temp 98.2°F | Resp 20

## 2022-10-31 DIAGNOSIS — J45901 Unspecified asthma with (acute) exacerbation: Secondary | ICD-10-CM | POA: Diagnosis not present

## 2022-10-31 DIAGNOSIS — Z1152 Encounter for screening for COVID-19: Secondary | ICD-10-CM | POA: Insufficient documentation

## 2022-10-31 MED ORDER — ALBUTEROL SULFATE HFA 108 (90 BASE) MCG/ACT IN AERS
2.0000 | INHALATION_SPRAY | Freq: Once | RESPIRATORY_TRACT | Status: AC
  Administered 2022-10-31: 2 via RESPIRATORY_TRACT

## 2022-10-31 MED ORDER — PREDNISONE 20 MG PO TABS: 40.0000 mg | ORAL_TABLET | Freq: Every day | ORAL | 0 refills | Status: AC

## 2022-10-31 NOTE — ED Provider Notes (Signed)
[name]    CSN: [id number] Arrival date & time: 10/31/22  1220      History   Chief Complaint Chief Complaint  Patient presents with   Chills   Cough   Generalized Body Aches   Headache   Shortness of Breath    HPI Darlene Gray is a 37 y.o. female.   Patient here today for evaluation of nonproductive cough, generalized bodyaches, headache, chills and shortness of breath that she has had for 2 days.  She notes she had a fever 2 days ago.  Temperature at this time was around 103.  She does have history of asthma and has noted some wheezing.  She does not currently have albuterol at home and cannot afford albuterol inhaler.  She denies any vomiting or diarrhea.  The history is provided by the patient.  Cough Associated symptoms: chills, fever, headaches, myalgias, shortness of breath, sore throat and wheezing   Associated symptoms: no ear pain and no eye discharge   Headache Associated symptoms: congestion, cough, fever, myalgias and sore throat   Associated symptoms: no abdominal pain, no diarrhea, no ear pain, no nausea and no vomiting   Shortness of Breath Associated symptoms: cough, fever, headaches, sore throat and wheezing   Associated symptoms: no abdominal pain, no ear pain and no vomiting     Past Medical History:  Diagnosis Date   Asthma    Ectopic pregnancy     There are no problems to display for this patient.   Past Surgical History:  Procedure Laterality Date   SALPINGECTOMY      OB History     Gravida  2   Para  1   Term  1   Preterm      AB  1   Living  1      SAB      IAB      Ectopic  1   Multiple      Live Births  1            Home Medications    Prior to Admission medications   Medication Sig Start Date End Date Taking? Authorizing Provider  predniSONE (DELTASONE) 20 MG tablet Take 2 tablets (40 mg total) by mouth daily with breakfast for 5 days. 10/31/22 11/05/22 Yes Francene Finders, PA-C   albuterol (VENTOLIN HFA) 108 (90 Base) MCG/ACT inhaler Inhale 1-2 puffs into the lungs every 6 (six) hours as needed for wheezing or shortness of breath. 05/13/22   Raspet, Junie Panning K, PA-C  amoxicillin-clavulanate (AUGMENTIN) 875-125 MG tablet Take 1 tablet by mouth every 12 (twelve) hours. 09/19/22   Ward, Lenise Arena, PA-C  bacitracin ointment Apply 1 application. topically 2 (two) times daily. 02/07/22   Jaynee Eagles, PA-C  benzonatate (TESSALON) 100 MG capsule Take 1 capsule (100 mg total) by mouth every 8 (eight) hours as needed for cough. 01/13/22   Teodora Medici, FNP  cetirizine (ZYRTEC) 10 MG tablet Take 1 tablet (10 mg total) by mouth daily. 01/13/22   Teodora Medici, FNP  fluticasone (FLONASE) 50 MCG/ACT nasal spray Place 1 spray into both nostrils daily for 3 days. 01/13/22 01/16/22  Teodora Medici, FNP  ibuprofen (ADVIL) 800 MG tablet Take 1 tablet (800 mg total) by mouth every 6 (six) hours as needed for moderate pain. 02/06/22   Orpah Greek, MD  methocarbamol (ROBAXIN) 500 MG tablet Take 1 tablet (500 mg total) by mouth every 8 (eight) hours as needed  for muscle spasms. 02/06/22   Gilda Crease, MD  naproxen (NAPROSYN) 500 MG tablet Take 1 tablet (500 mg total) by mouth 2 (two) times daily with a meal. 02/07/22   Wallis Bamberg, PA-C  nystatin-triamcinolone (MYCOLOG II) cream Apply to affected area daily Patient not taking: Reported on 07/28/2021 08/29/20   Dahlia Byes A, NP  promethazine-dextromethorphan (PROMETHAZINE-DM) 6.25-15 MG/5ML syrup Take 5 mLs by mouth 4 (four) times daily as needed for cough. 05/13/22   Raspet, Noberto Retort, PA-C  tiZANidine (ZANAFLEX) 4 MG tablet Take 1 tablet (4 mg total) by mouth at bedtime. 02/07/22   Wallis Bamberg, PA-C    Family History Family History  Problem Relation Age of Onset   Healthy Mother    Diabetes Maternal Grandmother     Social History Social History   Tobacco Use   Smoking status: Every Day    Packs/day: 0.50    Types: Cigarettes    Smokeless tobacco: Never  Vaping Use   Vaping Use: Never used  Substance Use Topics   Alcohol use: Yes    Comment: occasionally   Drug use: Yes    Types: Marijuana     Allergies   Shellfish allergy   Review of Systems Review of Systems  Constitutional:  Positive for chills and fever.  HENT:  Positive for congestion and sore throat. Negative for ear pain.   Eyes:  Negative for discharge and redness.  Respiratory:  Positive for cough, shortness of breath and wheezing.   Gastrointestinal:  Negative for abdominal pain, diarrhea, nausea and vomiting.  Musculoskeletal:  Positive for myalgias.  Neurological:  Positive for headaches.     Physical Exam Triage Vital Signs ED Triage Vitals [10/31/22 1235]  Enc Vitals Group     BP      Pulse      Resp      Temp      Temp src      SpO2      Weight      Height      Head Circumference      Peak Flow      Pain Score 6     Pain Loc      Pain Edu?      Excl. in GC?    No data found.  Updated Vital Signs BP 119/87 (BP Location: Left Arm)   Pulse (!) 110   Temp 98.2 F (36.8 C) (Oral)   Resp 20   LMP 10/30/2022   SpO2 93%      Physical Exam Vitals and nursing note reviewed.  Constitutional:      General: She is not in acute distress.    Appearance: Normal appearance. She is not ill-appearing.  HENT:     Head: Normocephalic and atraumatic.     Nose: Congestion present.     Mouth/Throat:     Mouth: Mucous membranes are moist.     Pharynx: No oropharyngeal exudate or posterior oropharyngeal erythema.  Eyes:     Conjunctiva/sclera: Conjunctivae normal.  Cardiovascular:     Rate and Rhythm: Normal rate and regular rhythm.     Heart sounds: Normal heart sounds. No murmur heard. Pulmonary:     Effort: Pulmonary effort is normal. No respiratory distress.     Breath sounds: Wheezing (mild scattered wheezes) present. No rhonchi or rales.  Skin:    General: Skin is warm and dry.  Neurological:     Mental Status: She  is alert.  Psychiatric:  Mood and Affect: Mood normal.        Thought Content: Thought content normal.      UC Treatments / Results  Labs (all labs ordered are listed, but only abnormal results are displayed) Labs Reviewed  SARS CORONAVIRUS 2 (TAT 6-24 HRS)    EKG   Radiology No results found.  Procedures Procedures (including critical care time)  Medications Ordered in UC Medications  albuterol (VENTOLIN HFA) 108 (90 Base) MCG/ACT inhaler 2 puff (2 puffs Inhalation Provided for home use 10/31/22 1254)    Initial Impression / Assessment and Plan / UC Course  I have reviewed the triage vital signs and the nursing notes.  Pertinent labs & imaging results that were available during my care of the patient were reviewed by me and considered in my medical decision making (see chart for details).    Albuterol inhaler administered in office and steroid burst prescribed to cover possible asthma exacerbation.  I suspect symptoms are viral in nature otherwise and given patient is outside of influenza treatment window will screen for COVID.  Encouraged follow-up if symptoms fail to improve or worsen in any way.  Final Clinical Impressions(s) / UC Diagnoses   Final diagnoses:  Asthma with acute exacerbation, unspecified asthma severity, unspecified whether persistent  Encounter for screening for COVID-19   Discharge Instructions   None    ED Prescriptions     Medication Sig Dispense Auth. Provider   predniSONE (DELTASONE) 20 MG tablet Take 2 tablets (40 mg total) by mouth daily with breakfast for 5 days. 10 tablet Francene Finders, PA-C      PDMP not reviewed this encounter.   Francene Finders, PA-C 10/31/22 1258

## 2022-10-31 NOTE — ED Triage Notes (Signed)
Pt presents with non productive cough, generalized body aches, headache, chills, and shortness of breath X 2 days.

## 2022-11-01 LAB — SARS CORONAVIRUS 2 (TAT 6-24 HRS): SARS Coronavirus 2: NEGATIVE

## 2023-02-15 DIAGNOSIS — O039 Complete or unspecified spontaneous abortion without complication: Secondary | ICD-10-CM

## 2023-02-15 HISTORY — DX: Complete or unspecified spontaneous abortion without complication: O03.9

## 2023-02-20 ENCOUNTER — Ambulatory Visit (HOSPITAL_COMMUNITY)
Admission: RE | Admit: 2023-02-20 | Discharge: 2023-02-20 | Disposition: A | Discharge status: Home / Self Care | Payer: No Typology Code available for payment source | Source: Ambulatory Visit | Attending: Emergency Medicine | Admitting: Emergency Medicine

## 2023-02-20 ENCOUNTER — Inpatient Hospital Stay (HOSPITAL_COMMUNITY)
Admission: AD | Admit: 2023-02-20 | Discharge: 2023-02-20 | Disposition: A | Discharge status: Home / Self Care | Payer: No Typology Code available for payment source | Attending: Obstetrics and Gynecology | Admitting: Obstetrics and Gynecology

## 2023-02-20 ENCOUNTER — Encounter (HOSPITAL_COMMUNITY): Payer: Self-pay | Admitting: Obstetrics and Gynecology

## 2023-02-20 ENCOUNTER — Encounter (HOSPITAL_COMMUNITY): Payer: Self-pay

## 2023-02-20 ENCOUNTER — Inpatient Hospital Stay (HOSPITAL_COMMUNITY): Payer: No Typology Code available for payment source

## 2023-02-20 VITALS — BP 152/89 | HR 98 | Temp 98.9°F | Resp 18

## 2023-02-20 DIAGNOSIS — R1030 Lower abdominal pain, unspecified: Secondary | ICD-10-CM | POA: Diagnosis not present

## 2023-02-20 DIAGNOSIS — Z3A01 Less than 8 weeks gestation of pregnancy: Secondary | ICD-10-CM | POA: Diagnosis not present

## 2023-02-20 DIAGNOSIS — O26891 Other specified pregnancy related conditions, first trimester: Secondary | ICD-10-CM | POA: Diagnosis not present

## 2023-02-20 DIAGNOSIS — R109 Unspecified abdominal pain: Secondary | ICD-10-CM | POA: Diagnosis not present

## 2023-02-20 DIAGNOSIS — Z3491 Encounter for supervision of normal pregnancy, unspecified, first trimester: Secondary | ICD-10-CM

## 2023-02-20 DIAGNOSIS — M545 Low back pain, unspecified: Secondary | ICD-10-CM | POA: Diagnosis present

## 2023-02-20 DIAGNOSIS — Z3201 Encounter for pregnancy test, result positive: Secondary | ICD-10-CM | POA: Diagnosis not present

## 2023-02-20 HISTORY — DX: Urinary tract infection, site not specified: N39.0

## 2023-02-20 HISTORY — DX: Gestational diabetes mellitus in pregnancy, unspecified control: O24.419

## 2023-02-20 LAB — CBC
HCT: 38.3 % (ref 36.0–46.0)
Hemoglobin: 13.6 g/dL (ref 12.0–15.0)
MCH: 32.5 pg (ref 26.0–34.0)
MCHC: 35.5 g/dL (ref 30.0–36.0)
MCV: 91.6 fL (ref 80.0–100.0)
Platelets: 362 10*3/uL (ref 150–400)
RBC: 4.18 MIL/uL (ref 3.87–5.11)
RDW: 12.9 % (ref 11.5–15.5)
WBC: 7.4 10*3/uL (ref 4.0–10.5)
nRBC: 0 % (ref 0.0–0.2)

## 2023-02-20 LAB — HCG, QUANTITATIVE, PREGNANCY: hCG, Beta Chain, Quant, S: 5695 m[IU]/mL — ABNORMAL HIGH (ref <5)

## 2023-02-20 LAB — POCT URINE PREGNANCY: Preg Test, Ur: POSITIVE — AB

## 2023-02-20 LAB — WET PREP, GENITAL
Clue Cells Wet Prep HPF POC: NONE SEEN
Sperm: NONE SEEN
Trich, Wet Prep: NONE SEEN
WBC, Wet Prep HPF POC: >=10 — AB (ref <10)
Yeast Wet Prep HPF POC: NONE SEEN

## 2023-02-20 LAB — ABO/RH: ABO/RH(D): B POS

## 2023-02-20 NOTE — Discharge Instructions (Signed)
Prenatal Care Providers           [facility]  [address] [phone]  [facility]   [address]  [phone]  [facility] [place]       [address] [phone]            [facility] @ [place]     [address] [phone]          [facility] [place]   [address] [phone]  [facility]  [address] [phone]     [facility])  [address]   [phone]     [facility]  Phone: [phone]  [facility]  Phone: [phone]  [facility] Phone: [phone]  [facility] Phone: [phone]  [facility] Phone: [phone]  [facility] Phone: [phone]  [facility]  Phone: [phone] Safe Medications in Pregnancy   Acne: Benzoyl Peroxide Salicylic Acid  Backache/Headache: Tylenol: 2 regular strength every 4 hours OR              2 Extra strength every 6 hours  Colds/Coughs/Allergies: Benadryl (alcohol free) 25 mg every 6 hours as needed Breath right strips Claritin Cepacol throat lozenges Chloraseptic throat spray Cold-Eeze- up to three times per day Cough drops, alcohol free Flonase (by prescription only) Guaifenesin Mucinex Robitussin DM (plain only, alcohol free) Saline nasal spray/drops Sudafed (pseudoephedrine) & Actifed ** use only after [redacted] weeks gestation and if you do not have high blood pressure Tylenol Vicks Vaporub Zinc lozenges Zyrtec   Constipation: Colace Ducolax suppositories Fleet enema Glycerin suppositories Metamucil Milk of magnesia Miralax Senokot Smooth move tea  Diarrhea: Kaopectate Imodium A-D  *NO pepto  Bismol  Hemorrhoids: Anusol Anusol HC Preparation H Tucks  Indigestion: Tums Maalox Mylanta Zantac  Pepcid  Insomnia: Benadryl (alcohol free) 25mg every 6 hours as needed Tylenol PM Unisom, no Gelcaps  Leg Cramps: Tums MagGel  Nausea/Vomiting:  Bonine Dramamine Emetrol Ginger extract Sea bands Meclizine  Nausea medication to take during pregnancy:  Unisom (doxylamine succinate 25 mg tablets) Take one tablet daily at bedtime. If symptoms are not adequately controlled, the dose can be increased to a maximum recommended dose of two tablets daily (1/2 tablet in the morning, 1/2 tablet mid-afternoon and one at bedtime). Vitamin B6 100mg tablets. Take one tablet twice a day (up to 200 mg per day).  Skin Rashes: Aveeno products Benadryl cream or 25mg every 6 hours as needed Calamine Lotion 1% cortisone cream  Yeast infection: Gyne-lotrimin 7 Monistat 7   **If taking multiple medications, please check labels to avoid duplicating the same active ingredients **take medication as directed on the label ** Do not exceed 4000 mg of tylenol in 24 hours **Do not take medications that contain aspirin or ibuprofen    

## 2023-02-20 NOTE — MAU Provider Note (Signed)
History     CSN: [id number]  Arrival date and time: 02/20/23 1339   Event Date/Time   First Provider Initiated Contact with Patient 02/20/23 1540      Chief Complaint  Patient presents with   Back Pain   Abdominal Pain   HPI  Darlene Gray is a 37 y.o. G3P1011 at [redacted]w[redacted]d who presents for evaluation of lower back pain. Patient reports she had a positive HPT after noticing her period was a week late. She state she has been having back pain for the whole week which made her feel like her period was coming. Patient rates the pain as a 4/10 and has not tried anything for the pain. She reports a hx of ruptured ectopic in the past so she was nervous about the pain. She denies any vaginal bleeding, discharge, and leaking of fluid. Denies any constipation, diarrhea or any urinary complaints.   OB History     Gravida  3   Para  1   Term  1   Preterm      AB  1   Living  1      SAB      IAB      Ectopic  1   Multiple      Live Births  1           Past Medical History:  Diagnosis Date   Asthma    Ectopic pregnancy 2014   Gestational diabetes    first preg   UTI (urinary tract infection)     Past Surgical History:  Procedure Laterality Date   SALPINGECTOMY      Family History  Problem Relation Age of Onset   Healthy Mother    Diabetes Maternal Grandmother     Social History   Tobacco Use   Smoking status: Every Day    Packs/day: 0.50    Years: 10.00    Additional pack years: 0.00    Total pack years: 5.00    Types: Cigarettes   Smokeless tobacco: Never  Vaping Use   Vaping Use: Never used  Substance Use Topics   Alcohol use: Yes    Comment: occasionally   Drug use: Not Currently    Types: Marijuana    Allergies:  Allergies  Allergen Reactions   Shellfish Allergy     Swelling     No medications prior to admission.    Review of Systems  Constitutional: Negative.  Negative for fatigue and fever.  HENT: Negative.    Respiratory:  Negative.  Negative for shortness of breath.   Cardiovascular: Negative.  Negative for chest pain.  Gastrointestinal: Negative.  Negative for abdominal pain, constipation, diarrhea, nausea and vomiting.  Genitourinary: Negative.  Negative for dysuria, vaginal bleeding and vaginal discharge.  Musculoskeletal:  Positive for back pain.  Neurological: Negative.  Negative for dizziness and headaches.   Physical Exam   Blood pressure 134/88, pulse 88, temperature 98 F (36.7 C), temperature source Oral, resp. rate 18, height 5\' 2"  (1.575 m), weight 98.6 kg, last menstrual period 01/15/2023, SpO2 100 %.  Patient Vitals for the past 24 hrs:  BP Temp Temp src Pulse Resp SpO2 Height Weight  02/20/23 1543 134/88 -- -- 88 18 -- -- --  02/20/23 1458 125/87 98 F (36.7 C) Oral 83 17 100 % 5\' 2"  (1.575 m) 98.6 kg    Physical Exam Vitals and nursing note reviewed.  Constitutional:      General: She is not in acute  distress.    Appearance: She is well-developed.  HENT:     Head: Normocephalic.  Eyes:     Pupils: Pupils are equal, round, and reactive to light.  Cardiovascular:     Rate and Rhythm: Normal rate and regular rhythm.     Heart sounds: Normal heart sounds.  Pulmonary:     Effort: Pulmonary effort is normal. No respiratory distress.     Breath sounds: Normal breath sounds.  Abdominal:     General: Bowel sounds are normal. There is no distension.     Palpations: Abdomen is soft.     Tenderness: There is no abdominal tenderness.  Skin:    General: Skin is warm and dry.  Neurological:     Mental Status: She is alert and oriented to person, place, and time.  Psychiatric:        Mood and Affect: Mood normal.        Behavior: Behavior normal.        Thought Content: Thought content normal.        Judgment: Judgment normal.     MAU Course  Procedures  Results for orders placed or performed during the hospital encounter of 02/20/23 (from the past 24 hour(s))  CBC     Status:  None   Collection Time: 02/20/23  1:56 PM  Result Value Ref Range   WBC 7.4 4.0 - 10.5 K/uL   RBC 4.18 3.87 - 5.11 MIL/uL   Hemoglobin 13.6 12.0 - 15.0 g/dL   HCT 16.1 09.6 - 04.5 %   MCV 91.6 80.0 - 100.0 fL   MCH 32.5 26.0 - 34.0 pg   MCHC 35.5 30.0 - 36.0 g/dL   RDW 40.9 81.1 - 91.4 %   Platelets 362 150 - 400 K/uL   nRBC 0.0 0.0 - 0.2 %  hCG, quantitative, pregnancy     Status: Abnormal   Collection Time: 02/20/23  1:56 PM  Result Value Ref Range   hCG, Beta Chain, Quant, S 5,695 (H) <5 mIU/mL  ABO/Rh     Status: None   Collection Time: 02/20/23  1:56 PM  Result Value Ref Range   ABO/RH(D) B POS    No rh immune globuloin      NOT A RH IMMUNE GLOBULIN CANDIDATE, PT RH POSITIVE Performed at [facility], [address], [place], [place] [address]   Wet prep, genital     Status: Abnormal   Collection Time: 02/20/23  3:08 PM   Specimen: PATH Cytology Cervicovaginal Ancillary Only  Result Value Ref Range   Yeast Wet Prep HPF POC NONE SEEN NONE SEEN   Trich, Wet Prep NONE SEEN NONE SEEN   Clue Cells Wet Prep HPF POC NONE SEEN NONE SEEN   WBC, Wet Prep HPF POC >=10 (A) <10   Sperm NONE SEEN      US OB LESS THAN 14 WEEKS WITH OB TRANSVAGINAL  Result Date: 02/20/2023 CLINICAL DATA:  Lower abdominal pain and back pain for 7 days EXAM: OBSTETRIC <14 WK ULTRASOUND TECHNIQUE: Transabdominal ultrasound was performed for evaluation of the gestation as well as the maternal uterus and adnexal regions. COMPARISON:  None Available. FINDINGS: Intrauterine gestational sac: Single Yolk sac:  Not Visualized. Embryo:  Not Visualized. Cardiac Activity: Not Visualized. Heart Rate: Not visualized. MSD:  4.1 mm   5 w   1  d Subchorionic hemorrhage:  None visualized. Maternal uterus/adnexae: Small corpus luteum of the right ovary. IMPRESSION: Single candidate intrauterine gestation at sonographic  gestational age of [redacted] weeks, 1 day. No fetal pole or fetal cardiac activity identified at this time.  Early candidate intrauterine gestation of uncertain viability. Recommend serial beta hCG and follow-up ultrasound in 7-14 days to assess for continued development and viability. Electronically Signed   By: Jearld Lesch M.D.   On: 02/20/2023 15:35     MDM Labs ordered and reviewed.   UA, UPT CBC, HCG ABO/Rh- B Pos Wet prep and gc/chlamydia US OB Comp Less 14 weeks with Transvaginal  CNM independently reviewed the imaging ordered. Imaging show intrauterine gestational sac only  CNM  reviewed results and recommendation for repeat u/s in 2 weeks. Appointment [facility] for 5/21 at [facility]  Assessment and Plan   1. Normal intrauterine pregnancy on prenatal ultrasound in first trimester   2. [redacted] weeks gestation of pregnancy    -Discharge home in stable condition -First trimester precautions discussed -Patient advised to follow-up with [facility] on 5/21 for repeat ultrasound -Patient may return to MAU as needed or if her condition were to change or worsen  Rolm Bookbinder, CNM 02/20/2023, 3:40 PM

## 2023-02-20 NOTE — MAU Note (Signed)
Darlene Gray is a 37 y.o. at [redacted]w[redacted]d here in MAU reporting: period was late, had back pain for a wk, seems to be going to the front on the rt side today.  Went to UC, confirmed preg.  Sent her for further eval.  Had ruptured ectopic 2014 requiring surgery.  No bleeding LMP: 3/31 Onset of complaint: a wk Pain score: 6 Vitals:   02/20/23 1458  BP: 125/87  Pulse: 83  Resp: 17  Temp: 98 F (36.7 C)  SpO2: 100%      Lab orders placed from triage:  by CNM

## 2023-02-20 NOTE — ED Provider Notes (Signed)
Presents for evaluation of lower back pain radiating to the lower abdomen for 7 days.  Also experiencing vaginal itching.  Missed last menstruation and took a home pregnancy test which was positive.  History of tubal pregnancy.   Positive pregnancy test in office therefore she is being sent to the maternity admission unit for further evaluation, will escort self, vital signs are stable   Valinda Hoar, NP 02/20/23 1336

## 2023-02-20 NOTE — ED Triage Notes (Signed)
Pt c/o lower back pain radiating to lower abdomen x1 wk. States standing is the worse. States having vaginal itching since yesterday. States late on cycle so took a preg test and it was positive.

## 2023-02-21 LAB — GC/CHLAMYDIA PROBE AMP (~~LOC~~) NOT AT ARMC
Chlamydia: NEGATIVE
Comment: NEGATIVE
Comment: NORMAL
Neisseria Gonorrhea: NEGATIVE

## 2023-02-28 ENCOUNTER — Other Ambulatory Visit: Payer: Self-pay

## 2023-02-28 DIAGNOSIS — O09521 Supervision of elderly multigravida, first trimester: Secondary | ICD-10-CM

## 2023-02-28 DIAGNOSIS — O3680X0 Pregnancy with inconclusive fetal viability, not applicable or unspecified: Secondary | ICD-10-CM

## 2023-03-06 ENCOUNTER — Inpatient Hospital Stay (HOSPITAL_COMMUNITY): Payer: No Typology Code available for payment source

## 2023-03-06 ENCOUNTER — Telehealth: Payer: Self-pay

## 2023-03-06 ENCOUNTER — Inpatient Hospital Stay (HOSPITAL_COMMUNITY)
Admission: AD | Admit: 2023-03-06 | Discharge: 2023-03-06 | Disposition: A | Discharge status: Home / Self Care | Payer: No Typology Code available for payment source | Attending: Obstetrics and Gynecology | Admitting: Obstetrics and Gynecology

## 2023-03-06 DIAGNOSIS — O021 Missed abortion: Secondary | ICD-10-CM | POA: Diagnosis not present

## 2023-03-06 DIAGNOSIS — O26851 Spotting complicating pregnancy, first trimester: Secondary | ICD-10-CM | POA: Insufficient documentation

## 2023-03-06 DIAGNOSIS — O26891 Other specified pregnancy related conditions, first trimester: Secondary | ICD-10-CM | POA: Diagnosis present

## 2023-03-06 DIAGNOSIS — O09521 Supervision of elderly multigravida, first trimester: Secondary | ICD-10-CM | POA: Diagnosis not present

## 2023-03-06 DIAGNOSIS — R109 Unspecified abdominal pain: Secondary | ICD-10-CM | POA: Diagnosis not present

## 2023-03-06 DIAGNOSIS — O09291 Supervision of pregnancy with other poor reproductive or obstetric history, first trimester: Secondary | ICD-10-CM | POA: Diagnosis not present

## 2023-03-06 DIAGNOSIS — Z3A01 Less than 8 weeks gestation of pregnancy: Secondary | ICD-10-CM | POA: Diagnosis not present

## 2023-03-06 LAB — CBC
HCT: 36.4 % (ref 36.0–46.0)
Hemoglobin: 12.3 g/dL (ref 12.0–15.0)
MCH: 31.5 pg (ref 26.0–34.0)
MCHC: 33.8 g/dL (ref 30.0–36.0)
MCV: 93.3 fL (ref 80.0–100.0)
Platelets: 326 10*3/uL (ref 150–400)
RBC: 3.9 MIL/uL (ref 3.87–5.11)
RDW: 12.9 % (ref 11.5–15.5)
WBC: 8.8 10*3/uL (ref 4.0–10.5)
nRBC: 0 % (ref 0.0–0.2)

## 2023-03-06 LAB — HCG, QUANTITATIVE, PREGNANCY: hCG, Beta Chain, Quant, S: 13739 m[IU]/mL — ABNORMAL HIGH (ref <5)

## 2023-03-06 MED ORDER — LORAZEPAM 2 MG PO TABS: 2.0000 mg | ORAL_TABLET | Freq: Once | ORAL | 0 refills | Status: AC

## 2023-03-06 MED ORDER — OXYCODONE-ACETAMINOPHEN 10-325 MG PO TABS: 1.0000 | ORAL_TABLET | ORAL | 0 refills | Status: DC | PRN

## 2023-03-06 MED ORDER — DOXYCYCLINE HYCLATE 100 MG PO CAPS: 200.0000 mg | ORAL_CAPSULE | Freq: Once | ORAL | 0 refills | Status: AC

## 2023-03-06 NOTE — MAU Note (Addendum)
..  Darlene Gray is a 37 y.o. at [redacted]w[redacted]d here in MAU reporting: Vaginal bleeding, only when she wipes.  Reports she has had back pain and abdominal pain the whole pregnancy and was seen for it already.  Last intercourse: today Pain score: 3/10 Vitals:   03/06/23 0327  BP: 132/82  Pulse: 85  Resp: 17  Temp: 98.2 F (36.8 C)  SpO2: 100%

## 2023-03-06 NOTE — Telephone Encounter (Signed)
Called patient, no answer, left voicemail with surgery date, time, location, preop instructions and callback number. 

## 2023-03-06 NOTE — Telephone Encounter (Signed)
Patient called to cancel the procedure on 03/08/23 w/ Dr. Donavan Foil. Stated, she would rather receive the pill that Dr. Donavan Foil offered this morning as a second option in resolving her issue. Appt was cancelled with Central Scheduling.

## 2023-03-06 NOTE — MAU Provider Note (Signed)
History     CSN: [id number]  Arrival date and time: 03/06/23 6962   Event Date/Time   First Provider Initiated Contact with Patient 03/06/23 [phone]      Chief Complaint  Patient presents with   Vaginal Bleeding   Abdominal Pain    Darlene Gray is a 37 y.o. G3P1011 at [redacted]w[redacted]d by LMP.  She presents today for vaginal bleeding.  Patient states she was seen ~ 2 weeks ago for   She states that she went to the bathroom around 0100 and noted bleeding with wiping.  She states she went back 2 more times and noted bleeding.  She states the blood was "pinkish reddish." She denies discharge prior to the bleeding. She does endorse sexual activity tonight, but denies pain or discomfort.   OB History     Gravida  3   Para  1   Term  1   Preterm      AB  1   Living  1      SAB      IAB      Ectopic  1   Multiple      Live Births  1           Past Medical History:  Diagnosis Date   Asthma    Ectopic pregnancy 2014   Gestational diabetes    first preg   UTI (urinary tract infection)     Past Surgical History:  Procedure Laterality Date   SALPINGECTOMY      Family History  Problem Relation Age of Onset   Healthy Mother    Diabetes Maternal Grandmother     Social History   Tobacco Use   Smoking status: Every Day    Packs/day: 0.50    Years: 10.00    Additional pack years: 0.00    Total pack years: 5.00    Types: Cigarettes   Smokeless tobacco: Never  Vaping Use   Vaping Use: Never used  Substance Use Topics   Alcohol use: Yes    Comment: occasionally   Drug use: Not Currently    Types: Marijuana    Allergies:  Allergies  Allergen Reactions   Shellfish Allergy     Swelling     Medications Prior to Admission  Medication Sig Dispense Refill Last Dose   albuterol (VENTOLIN HFA) 108 (90 Base) MCG/ACT inhaler Inhale 1-2 puffs into the lungs every 6 (six) hours as needed for wheezing or shortness of breath. 1 each 1     aspirin-acetaminophen-caffeine (EXCEDRIN MIGRAINE) 250-250-65 MG tablet Take 2 tablets by mouth every 6 (six) hours as needed for headache.      bacitracin ointment Apply 1 application. topically 2 (two) times daily. 120 g 0    cetirizine (ZYRTEC) 10 MG tablet Take 1 tablet (10 mg total) by mouth daily. 30 tablet 0    fluticasone (FLONASE) 50 MCG/ACT nasal spray Place 1 spray into both nostrils daily for 3 days. 16 g 0    ibuprofen (ADVIL) 800 MG tablet Take 1 tablet (800 mg total) by mouth every 6 (six) hours as needed for moderate pain. 20 tablet 0     Review of Systems  Gastrointestinal:  Positive for abdominal pain (Cramping) and constipation (No bm x 2 days, but states normal). Negative for diarrhea, nausea and vomiting.  Genitourinary:  Positive for vaginal bleeding. Negative for difficulty urinating, dysuria and vaginal discharge.  Musculoskeletal:  Positive for back pain (Pressure, relieved with sitting).   Physical  Exam   Blood pressure 132/82, pulse 85, temperature 98.2 F (36.8 C), temperature source Oral, resp. rate 17, height 5\' 2"  (1.575 m), weight 101.7 kg, last menstrual period 01/15/2023, SpO2 100 %.  Physical Exam Constitutional:      Appearance: She is well-developed.  HENT:     Head: Normocephalic and atraumatic.  Eyes:     Conjunctiva/sclera: Conjunctivae normal.  Cardiovascular:     Rate and Rhythm: Tachycardia present.  Pulmonary:     Effort: Pulmonary effort is normal. No respiratory distress.  Musculoskeletal:        General: Normal range of motion.     Cervical back: Normal range of motion.  Neurological:     Mental Status: She is alert and oriented to person, place, and time.  Psychiatric:        Mood and Affect: Mood normal.        Behavior: Behavior normal.     MAU Course  Procedures No results found for this or any previous visit (from the past 24 hour(s)). US OB Transvaginal  Result Date: 03/06/2023 CLINICAL DATA:  First trimester vaginal  bleeding. EXAM: TRANSVAGINAL OB ULTRASOUND TECHNIQUE: Transvaginal ultrasound was performed for complete evaluation of the gestation as well as the maternal uterus, adnexal regions, and pelvic cul-de-sac. COMPARISON:  Study of 02/20/2023 showing a single gestational sac measuring 5 weeks 1 day with no visible yolk sac or fetal pole. FINDINGS: Intrauterine gestational sac: Single gestational sac is again demonstrated, but it is not much larger than previously and it is now irregular in shape. Yolk sac:  Not Visualized. Embryo:  Not Visualized. Cardiac Activity: N/a. MSD: 7.9 mm   5 w   4 d CRL: None. Subchorionic hemorrhage:  None visualized. Maternal uterus/adnexae: The uterus is anteverted measures 9.9 x 5.3 x 6.5 cm. No wall mass is seen. The cervix is closed measuring 3.3 cm in length and is unremarkable. There is a 1.6 cm heterogeneously hypoechoic rounded mass in between the right ovary and uterus which was not seen on the prior study, or could have been present and not appreciated. There is trace color flow within this. The sonographic appearance could be consistent with either a pedunculated fibroid or an ectopic pregnancy. The technologist was unable to confirm a connection to the uterus. Both ovaries are normal in size with color flow and no significant findings. IMPRESSION: On 02/20/2023, a rounded likely gestational sac measuring 5 weeks 1 day was noted, without a visible yolk sac or embryo. Today there is an irregular sac measuring 5 weeks 4 days, again with no visible fetal pole or yolk sac. The gestational sac findings in isolation would be most suggestive of a failed pregnancy, but there is also a 1.6 cm rounded mass in between the right ovary and uterus not previously seen, which raises the possibility of an ectopic pregnancy with pseudogestational sac. Alternatively this could be a pedunculated fibroid but the technologist was unable to prove connection to the uterus. Consultation with an  obstetrician is recommended. If intervention is not undertaken, close clinical follow-up and serial beta HCG measurements are recommended as well as short interval follow-up ultrasound. Electronically Signed   By: Almira Bar M.D.   On: 03/06/2023 04:53    MDM Ultrasound Labs: UA, CBC, hCG Prescription Assessment and Plan  37 year old G3P1011 at 7.1 weeks MAB  -Patient sent for [place] and results as above. -Provider to bedside to review findings. -Informed that no significant change from previous US 2 weeks  ago. -Condolences given. -Reviewed management options including: *Expectant Management *Pharmaceutical with cytotec (Misoprostol) *Surgical via Dilation and curettage or in-office MVA. -Patient opts for surgical management with in office MVA or D&C if unable to get scheduled for MVA. -Reviewed medications to be taken prior to procedure.  -Rx and message sent to [facility] per protocol.  -Patient verbalizes understanding. -Bleeding precautions reviewed.  -Encouraged to call primary office or return to MAU if symptoms worsen or with the onset of new symptoms. -Discharged to home in stable condition.   Cherre Robins 03/06/2023, 4:56 AM

## 2023-03-07 ENCOUNTER — Other Ambulatory Visit: Payer: No Typology Code available for payment source

## 2023-03-07 NOTE — Telephone Encounter (Signed)
Error

## 2023-03-08 ENCOUNTER — Other Ambulatory Visit: Payer: Self-pay | Admitting: Obstetrics and Gynecology

## 2023-03-08 ENCOUNTER — Telehealth: Payer: Self-pay

## 2023-03-08 ENCOUNTER — Encounter (HOSPITAL_COMMUNITY): Admission: RE | Payer: Self-pay | Source: Home / Self Care

## 2023-03-08 ENCOUNTER — Ambulatory Visit (HOSPITAL_COMMUNITY)
Admission: RE | Admit: 2023-03-08 | Payer: No Typology Code available for payment source | Source: Home / Self Care | Admitting: Obstetrics and Gynecology

## 2023-03-08 DIAGNOSIS — O021 Missed abortion: Secondary | ICD-10-CM

## 2023-03-08 SURGERY — DILATION AND EVACUATION, UTERUS
Anesthesia: Choice

## 2023-03-08 MED ORDER — ONDANSETRON HCL 4 MG PO TABS: 4.0000 mg | ORAL_TABLET | Freq: Three times a day (TID) | ORAL | 0 refills | Status: DC | PRN

## 2023-03-08 MED ORDER — IBUPROFEN 600 MG PO TABS
600.0000 mg | ORAL_TABLET | Freq: Four times a day (QID) | ORAL | 2 refills | Status: DC | PRN
Start: 2023-03-08 — End: 2023-03-21

## 2023-03-08 MED ORDER — MISOPROSTOL 200 MCG PO TABS
ORAL_TABLET | ORAL | 0 refills | Status: DC
Start: 2023-03-08 — End: 2023-03-21

## 2023-03-08 MED ORDER — OXYCODONE HCL 5 MG PO TABS
5.0000 mg | ORAL_TABLET | ORAL | 0 refills | Status: DC | PRN
Start: 2023-03-08 — End: 2023-03-21

## 2023-03-08 NOTE — Telephone Encounter (Signed)
Telephone call to patient.  I had been cc'd on a message from surgery scheduler that patient had elected to cancel her D&E and wanted to try cytotec.  The message was routed to Dr. Donavan Foil who was the scheduled surgeon for procedure.    Patient confirmed she had had not follow since she cancelled the procedure and that she had been calling around since Monday to try to get the desired prescription.   I notified patient I would have provider follow up with her regarding prescription and follow up needs.   [facility] is her correct pharmacy.

## 2023-03-08 NOTE — Progress Notes (Signed)
Early Intrauterine Pregnancy Failure  _x__  Documented intrauterine pregnancy failure less than or equal to [redacted] weeks gestation  __x_  No serious current illness  __x_  Baseline Hgb greater than or equal to 10g/dl  _x__  Patient has easily accessible transportation to the hospital  ___  Clear preference  __x_  Practitioner/physician deems patient reliable  __x_  Counseling by practitioner or physician  ___  Patient education by RN   ___  Rho-Gam given by RN if indicated  __x_  Medication dispensed   __x_   Cytotec 800 mcg  _x_   Intravaginally by patient at home         __   Intravaginally by RN in MAU        __   Rectally by patient at home        __   Rectally by RN in MAU  __x_  Ibuprofen 600 mg 1 tablet by mouth every 6 hours as needed #30  __x_  oxycodone 5 mg mg by mouth every 4 to 6 hours as needed  __x_  zofran 4 mg mg by mouth every 4 hours as needed for nausea   Spoke to patient on the phone and went over the procedure and follow up. Precautions given.  Pt advised she can be seen at the hospital /MAU at any time if the bleeding is too heavy or she is experiencing too much pain. Will reach out to [facility] regarding follow up in 1-2 weeks.  Mariel Aloe, MD

## 2023-03-21 ENCOUNTER — Ambulatory Visit: Payer: Medicaid Other | Admitting: Obstetrics and Gynecology

## 2023-03-21 ENCOUNTER — Encounter: Payer: Self-pay | Admitting: Obstetrics and Gynecology

## 2023-03-21 VITALS — BP 131/90 | HR 95 | Wt 218.5 lb

## 2023-03-21 DIAGNOSIS — Z3A01 Less than 8 weeks gestation of pregnancy: Secondary | ICD-10-CM | POA: Diagnosis not present

## 2023-03-21 DIAGNOSIS — O039 Complete or unspecified spontaneous abortion without complication: Secondary | ICD-10-CM

## 2023-03-21 NOTE — Progress Notes (Signed)
Pt presents for follow up s/p SAB on 03/08/23. Condoms is the preferred method per pt

## 2023-03-21 NOTE — Progress Notes (Signed)
  Cc: miscarriage follow up Subjective:    Patient ID: Darlene Gray, female    DOB: [birth year], 37 y.o.   MRN: [id number]  HPI 37 yo G3P1 seen for miscarriage follow up.  Pt had lack of progression and took cytotec on 03/08/23.  Pt stated she bled several hours later and bleeding stopped on 03/19/23.  Pt has minimal symptoms at this time.  Review of Systems     Objective:   Physical Exam Vitals:   03/21/23 1355  BP: (!) 131/90  Pulse: 95   SVE: cervix  and vagina WNL.  No active bleeding or old blood noted.      Assessment & Plan:   1. Miscarriage Will check bhcg and trend until normal  - Beta hCG quant (ref lab)    Warden Fillers, MD Faculty Attending, [facility]

## 2023-03-22 LAB — BETA HCG QUANT (REF LAB): hCG Quant: 44 m[IU]/mL

## 2023-03-22 NOTE — Progress Notes (Signed)
TC to pt. Advised of need to repeat bHCG in 2 weeks. Pt denies acute symptoms. Condolences offered. Call transferred to schedulers.

## 2023-04-05 ENCOUNTER — Other Ambulatory Visit: Payer: Medicaid Other

## 2023-04-05 DIAGNOSIS — O039 Complete or unspecified spontaneous abortion without complication: Secondary | ICD-10-CM

## 2023-04-06 LAB — BETA HCG QUANT (REF LAB): hCG Quant: 1 m[IU]/mL

## 2023-09-20 ENCOUNTER — Encounter: Payer: Self-pay | Admitting: Emergency Medicine

## 2023-09-20 ENCOUNTER — Ambulatory Visit
Admission: EM | Admit: 2023-09-20 | Discharge: 2023-09-20 | Disposition: A | Discharge status: Home / Self Care | Payer: Medicaid Other | Attending: Family Medicine | Admitting: Family Medicine

## 2023-09-20 DIAGNOSIS — R21 Rash and other nonspecific skin eruption: Secondary | ICD-10-CM

## 2023-09-20 MED ORDER — CHLORHEXIDINE GLUCONATE 4 % EX SOLN: CUTANEOUS | 2 refills | Status: DC

## 2023-09-20 MED ORDER — DOXYCYCLINE HYCLATE 100 MG PO CAPS: 100.0000 mg | ORAL_CAPSULE | Freq: Two times a day (BID) | ORAL | 0 refills | Status: DC

## 2023-09-20 NOTE — ED Triage Notes (Signed)
Pt presents to U/C with reports of multiple cysts to under part and side of bilateral breast x 6 months ago pt reports they come and go.

## 2023-09-21 NOTE — ED Provider Notes (Signed)
[facility]   [id number] 09/20/23 Arrival Time: 1324  ASSESSMENT & PLAN:  1. Rash and nonspecific skin eruption    No abscess formation. Trial of: Meds ordered this encounter  Medications   doxycycline (VIBRAMYCIN) 100 MG capsule    Sig: Take 1 capsule (100 mg total) by mouth 2 (two) times daily.    Dispense:  20 capsule    Refill:  0   chlorhexidine (HIBICLENS) 4 % external liquid    Sig: Use topically while bathing 3-4 times weekly.    Dispense:  473 mL    Refill:  2   Dermatology referral placed.  Reviewed expectations re: course of current medical issues. Questions answered. Outlined signs and symptoms indicating need for more acute intervention. Patient verbalized understanding. After Visit Summary given.   SUBJECTIVE:  Darlene Gray is a 37 y.o. female who presents with a skin complaint. Pt presents to U/C with reports of multiple cysts to under part and side of bilateral breast x 6 months ago pt reports they come and go.    OBJECTIVE: Vitals:   09/20/23 1517  BP: 138/88  Pulse: 88  Resp: 18  Temp: 98.2 F (36.8 C)  TempSrc: Oral  SpO2: 98%    General appearance: alert; no distress Extremities: no edema; moves all extremities normally Skin: warm and dry; scattered circular slightly indurated sub-cm areas of darkened skin under both breasts; no areas of fluctuance; without overlying erythema; non-TTP; without drainage or bleeding Psychological: alert and cooperative; normal mood and affect  Allergies  Allergen Reactions   Shellfish Allergy     Swelling     Past Medical History:  Diagnosis Date   Asthma    Ectopic pregnancy 2014   Gestational diabetes    first preg   Miscarriage 02/2023   UTI (urinary tract infection)    Social History   Socioeconomic History   Marital status: Single    Spouse name: Not on file   Number of children: 1   Years of education: Not on file   Highest education level: Bachelor's degree (e.g., BA,  AB, BS)  Occupational History   Not on file  Tobacco Use   Smoking status: Every Day    Current packs/day: 0.50    Average packs/day: 0.5 packs/day for 10.0 years (5.0 ttl pk-yrs)    Types: Cigarettes    Passive exposure: Current   Smokeless tobacco: Never  Vaping Use   Vaping status: Never Used  Substance and Sexual Activity   Alcohol use: Yes    Comment: occasionally   Drug use: Not Currently    Types: Marijuana   Sexual activity: Yes    Birth control/protection: None  Other Topics Concern   Not on file  Social History Narrative   Not on file   Social Determinants of Health   Financial Resource Strain: Not on file  Food Insecurity: Food Insecurity Present (12/26/2018)   Hunger Vital Sign    Worried About Running Out of Food in the Last Year: Often true    Ran Out of Food in the Last Year: Often true  Transportation Needs: No Transportation Needs (12/06/2018)   PRAPARE - Administrator, Civil Service (Medical): No    Lack of Transportation (Non-Medical): No  Physical Activity: Not on file  Stress: Not on file  Social Connections: Not on file  Intimate Partner Violence: Not on file   Family History  Problem Relation Age of Onset   Healthy Mother  Diabetes Maternal Grandmother    Past Surgical History:  Procedure Laterality Date   SALPINGECTOMY        Mardella Layman, MD 09/21/23 1009
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
## 2024-03-16 ENCOUNTER — Encounter (HOSPITAL_COMMUNITY): Payer: Self-pay

## 2024-03-16 ENCOUNTER — Emergency Department (HOSPITAL_COMMUNITY)
Admission: EM | Admit: 2024-03-16 | Discharge: 2024-03-16 | Disposition: A | Discharge status: Home / Self Care | Attending: Emergency Medicine | Admitting: Emergency Medicine

## 2024-03-16 ENCOUNTER — Emergency Department (HOSPITAL_COMMUNITY)

## 2024-03-16 ENCOUNTER — Other Ambulatory Visit: Payer: Self-pay

## 2024-03-16 DIAGNOSIS — Z7951 Long term (current) use of inhaled steroids: Secondary | ICD-10-CM | POA: Diagnosis not present

## 2024-03-16 DIAGNOSIS — J45909 Unspecified asthma, uncomplicated: Secondary | ICD-10-CM | POA: Insufficient documentation

## 2024-03-16 DIAGNOSIS — R0789 Other chest pain: Secondary | ICD-10-CM | POA: Insufficient documentation

## 2024-03-16 DIAGNOSIS — R42 Dizziness and giddiness: Secondary | ICD-10-CM | POA: Diagnosis present

## 2024-03-16 LAB — BASIC METABOLIC PANEL WITH GFR
Anion gap: 11 (ref 5–15)
BUN: 8 mg/dL (ref 6–20)
CO2: 21 mmol/L — ABNORMAL LOW (ref 22–32)
Calcium: 9 mg/dL (ref 8.9–10.3)
Chloride: 103 mmol/L (ref 98–111)
Creatinine, Ser: 0.89 mg/dL (ref 0.44–1.00)
GFR, Estimated: >60 mL/min (ref >60)
Glucose, Bld: 91 mg/dL (ref 70–99)
Potassium: 3.9 mmol/L (ref 3.5–5.1)
Sodium: 135 mmol/L (ref 135–145)

## 2024-03-16 LAB — CBC
HCT: 38.6 % (ref 36.0–46.0)
Hemoglobin: 13.2 g/dL (ref 12.0–15.0)
MCH: 32.4 pg (ref 26.0–34.0)
MCHC: 34.2 g/dL (ref 30.0–36.0)
MCV: 94.6 fL (ref 80.0–100.0)
Platelets: 301 10*3/uL (ref 150–400)
RBC: 4.08 MIL/uL (ref 3.87–5.11)
RDW: 13 % (ref 11.5–15.5)
WBC: 5.3 10*3/uL (ref 4.0–10.5)
nRBC: 0 % (ref 0.0–0.2)

## 2024-03-16 LAB — TROPONIN I (HIGH SENSITIVITY)
Troponin I (High Sensitivity): 6 ng/L (ref <18)
Troponin I (High Sensitivity): 5 ng/L (ref <18)

## 2024-03-16 LAB — HCG, SERUM, QUALITATIVE: Preg, Serum: NEGATIVE

## 2024-03-16 LAB — D-DIMER, QUANTITATIVE: D-Dimer, Quant: 0.87 ug{FEU}/mL — ABNORMAL HIGH (ref 0.00–0.50)

## 2024-03-16 MED ORDER — IOHEXOL 350 MG/ML SOLN
75.0000 mL | Freq: Once | INTRAVENOUS | Status: AC | PRN
  Administered 2024-03-16: 75 mL via INTRAVENOUS

## 2024-03-16 NOTE — ED Notes (Signed)
 Patient transported to CT

## 2024-03-16 NOTE — ED Notes (Signed)
 Patient returned from CT

## 2024-03-16 NOTE — ED Triage Notes (Addendum)
 Pt c.o dizziness x 2 weeks with associated chest pain and sob that started last week. Pt tried OTC meclizine without any relief

## 2024-03-16 NOTE — ED Provider Notes (Signed)
 [facility] Provider Note   CSN: [id number] Arrival date & time: 03/16/24  1214     History  Chief Complaint  Patient presents with   Dizziness   Chest Pain    Darlene Gray is a 38 y.o. female history of asthma, vertigo presents with complaints of dizziness and chest pain.  Patient reports she has been feeling dizzy like the room is spinning and near syncopal for the past few weeks.  Not associated with vision changes.  Does note that she will sometimes have headaches.  Symptoms are worse with movement.  Notes that she recently started having chest pain as well.  Will feel somewhat short of breath.  Symptoms are not exertional.  No cardiac history or prior blood clots.   Dizziness Associated symptoms: chest pain   Chest Pain Associated symptoms: dizziness    Past Medical History:  Diagnosis Date   Asthma    Ectopic pregnancy 2014   Gestational diabetes    first preg   Miscarriage 02/2023   UTI (urinary tract infection)    Past Surgical History:  Procedure Laterality Date   SALPINGECTOMY         Home Medications Prior to Admission medications   Medication Sig Start Date End Date Taking? Authorizing Provider  chlorhexidine  (HIBICLENS ) 4 % external liquid Use topically while bathing 3-4 times weekly. 09/20/23   Afton Albright, MD  doxycycline  (VIBRAMYCIN ) 100 MG capsule Take 1 capsule (100 mg total) by mouth 2 (two) times daily. 09/20/23   Afton Albright, MD  fluticasone  (FLONASE ) 50 MCG/ACT nasal spray Place 1 spray into both nostrils daily for 3 days. 01/13/22 01/16/22  Dodson Freestone, FNP      Allergies    Shellfish allergy    Review of Systems   Review of Systems  Cardiovascular:  Positive for chest pain.  Neurological:  Positive for dizziness.    Physical Exam Updated Vital Signs BP (!) 138/106 (BP Location: Right Arm)   Pulse 70   Temp 98.4 F (36.9 C) (Oral)   Resp 17   Ht 5\' 2"  (1.575 m)   Wt 99.1 kg    LMP 03/14/2024 (Approximate)   SpO2 100%   BMI 39.96 kg/m  Physical Exam Vitals and nursing note reviewed.  Constitutional:      General: She is not in acute distress.    Appearance: She is well-developed.  HENT:     Head: Normocephalic and atraumatic.  Eyes:     Conjunctiva/sclera: Conjunctivae normal.  Cardiovascular:     Rate and Rhythm: Normal rate and regular rhythm.     Heart sounds: No murmur heard. Pulmonary:     Effort: Pulmonary effort is normal. No respiratory distress.     Breath sounds: Normal breath sounds.  Abdominal:     Palpations: Abdomen is soft.     Tenderness: There is no abdominal tenderness.  Musculoskeletal:        General: No swelling.     Cervical back: Neck supple.  Skin:    General: Skin is warm and dry.     Capillary Refill: Capillary refill takes less than 2 seconds.  Neurological:     Mental Status: She is alert.     Comments: Patient is alert and oriented. There is no abnormal phonation. Symmetric smile without facial droop.  Moves all extremities spontaneously. 5/5 strength in upper and lower extremities. . No sensation deficit. There is no nystagmus. EOMI, PERRL. Coordination intact with finger  to nose and normal ambulation.    Psychiatric:        Mood and Affect: Mood normal.     ED Results / Procedures / Treatments   Labs (all labs ordered are listed, but only abnormal results are displayed) Labs Reviewed  BASIC METABOLIC PANEL WITH GFR - Abnormal; Notable for the following components:      Result Value   CO2 21 (*)    All other components within normal limits  D-DIMER, QUANTITATIVE - Abnormal; Notable for the following components:   D-Dimer, Quant 0.87 (*)    All other components within normal limits  CBC  HCG, SERUM, QUALITATIVE  TROPONIN I (HIGH SENSITIVITY)  TROPONIN I (HIGH SENSITIVITY)    EKG EKG Interpretation Date/Time:  Saturday Mar 16 2024 12:25:19 EDT Ventricular Rate:  92 PR Interval:  144 QRS Duration:  70 QT  Interval:  344 QTC Calculation: 425 R Axis:   38  Text Interpretation: Normal sinus rhythm  no acute ST/T changes No previous ECGs available Confirmed by Jerilynn Montenegro [phone]) on 03/16/2024 2:51:48 PM  Radiology CT Angio Chest PE W and/or Wo Contrast Result Date: 03/16/2024 EXAM: CTA of the Chest with contrast for PE 03/16/2024 07:31:22 PM TECHNIQUE: CTA of the chest was performed after the administration of intravenous contrast. Multiplanar reformatted images are provided for review. MIP images are provided for review. Automated exposure control, iterative reconstruction, and/or weight based adjustment of the mA/kV was utilized to reduce the radiation dose to as low as reasonably achievable. COMPARISON: None available. CLINICAL HISTORY: Pulmonary embolism (PE) suspected, low to intermediate prob, positive D-dimer. Chief complaints; Dizziness; Chest Pain. FINDINGS: PULMONARY ARTERIES: Pulmonary arteries are adequately opacified for evaluation. No evidence of pulmonary embolism. Main pulmonary artery is normal in caliber. MEDIASTINUM: The heart and pericardium demonstrate no acute abnormality. There is no acute abnormality of the thoracic aorta. LYMPH NODES: No mediastinal, hilar or axillary lymphadenopathy. LUNGS AND PLEURA: 4 mm subpleural nodule in the anterior right middle lobe, likely benign. No focal consolidation or pulmonary edema. No evidence of pleural effusion or pneumothorax. UPPER ABDOMEN: Limited images of the upper abdomen are unremarkable. SOFT TISSUES AND BONES: No acute bone or soft tissue abnormality. IMPRESSION: 1. No evidence of pulmonary embolism. 2. 4 mm subpleural nodule in the anterior right middle lobe, likely benign. No follow-up is recommended per Fleischner Society guidelines. Electronically signed by: Zadie Herter MD 03/16/2024 07:39 PM EDT RP Workstation: [id number]   DG Chest 2 View Result Date: 03/16/2024 CLINICAL DATA:  chest pain, sob EXAM: CHEST - 2 VIEW COMPARISON:   None Available. FINDINGS: The cardiomediastinal silhouette is normal in contour. No pleural effusion. No pneumothorax. No acute pleuroparenchymal abnormality. Visualized abdomen is unremarkable. No acute osseous abnormality noted. IMPRESSION: No acute cardiopulmonary abnormality. Electronically Signed   By: Clancy Crimes M.D.   On: 03/16/2024 13:05    Procedures Procedures    Medications Ordered in ED Medications  iohexol  (OMNIPAQUE ) 350 MG/ML injection 75 mL (75 mLs Intravenous Contrast Given 03/16/24 1931)    ED Course/ Medical Decision Making/ A&P                                 Medical Decision Making Amount and/or Complexity of Data Reviewed Labs: ordered. Radiology: ordered.  Risk Prescription drug management.   This patient presents to the ED with chief complaint(s) of dizziness and chest pain.  The complaint involves an extensive differential diagnosis  and also carries with it a high risk of complications and morbidity.   Pertinent past medical history as listed in HPI  The differential diagnosis includes  Central versus peripheral vertigo, ACS, PE, dissection, pneumothorax, pneumonia Additional history obtained: Records reviewed Care Everywhere/External Records  Assessment and management:   Patient presents hypertensive 143/104 with complaints of dizziness and chest pain.  Dizziness started 3 weeks ago is described as room spinning and near syncopal.  Is worse with movements.  She does have a history of vertigo.  She has tried meclizine without significant improvement.  Additionally reporting chest pain with some shortness of breath.  Is not exertional.  No cardiac history or prior blood clots.  Do not suspect dissection. Patient's lab work was overall reassuring, other than elevated dimer.  CT angio PE however is negative.  She has no lower extremity edema, tenderness or pain to suggest DVT.  She has no neurodeficits and her picture is consistent with peripheral  vertigo.  Patient provided info for Epley maneuver, encouraged to continue meclizine and follow-up with her primary care doctor  Independent ECG interpretation:  Normal sinus rhythm, nonspecific T wave abnormality  Independent labs interpretation:  The following labs were independently interpreted:  BMP without significant abnormality, CBC unremarkable, hCG negative, troponin without elevation  Independent visualization and interpretation of imaging: I independently visualized the following imaging with scope of interpretation limited to determining acute life threatening conditions related to emergency care:  Chest x-ray without cardiopulmonary disease CT angio PE negative  Consultations obtained:   none  Disposition:   Patient will be discharged home. The patient has been appropriately medically screened and/or stabilized in the ED. I have low suspicion for any other emergent medical condition which would require further screening, evaluation or treatment in the ED or require inpatient management. At time of discharge the patient is hemodynamically stable and in no acute distress. I have discussed work-up results and diagnosis with patient and answered all questions. Patient is agreeable with discharge plan. We discussed strict return precautions for returning to the emergency department and they verbalized understanding.     Social Determinants of Health:   Patient's impaired access to primary care  increases the complexity of managing their presentation  This note was dictated with voice recognition software.  Despite best efforts at proofreading, errors may have occurred which can change the documentation meaning.          Final Clinical Impression(s) / ED Diagnoses Final diagnoses:  Dizziness  Atypical chest pain    Rx / DC Orders ED Discharge Orders     None         Stanton Earthly 03/16/24 2017    Jerilynn Montenegro, MD 03/17/24 1318

## 2024-03-16 NOTE — ED Notes (Signed)
 Per CT, two ahead of patient for scan

## 2024-03-16 NOTE — Discharge Instructions (Addendum)
 You were evaluated in the emergency room for chest pain and dizziness.  Your CT scans showed no blood clot, the remainder of your lab work did not show any significant abnormality.  Please try the Epley maneuver when you are symptomatic and schedule an appointment with your primary care doctor.

## 2024-05-13 ENCOUNTER — Ambulatory Visit: Admitting: Dermatology

## 2024-06-26 ENCOUNTER — Other Ambulatory Visit: Payer: Self-pay

## 2024-06-26 ENCOUNTER — Emergency Department (HOSPITAL_COMMUNITY)

## 2024-06-26 ENCOUNTER — Emergency Department (HOSPITAL_COMMUNITY)
Admission: EM | Admit: 2024-06-26 | Discharge: 2024-06-26 | Discharge status: Against Medical Advice | Attending: Student | Admitting: Student

## 2024-06-26 DIAGNOSIS — R42 Dizziness and giddiness: Secondary | ICD-10-CM | POA: Insufficient documentation

## 2024-06-26 DIAGNOSIS — R519 Headache, unspecified: Secondary | ICD-10-CM | POA: Insufficient documentation

## 2024-06-26 DIAGNOSIS — Z5321 Procedure and treatment not carried out due to patient leaving prior to being seen by health care provider: Secondary | ICD-10-CM | POA: Insufficient documentation

## 2024-06-26 LAB — CBC WITH DIFFERENTIAL/PLATELET
Abs Immature Granulocytes: 0.01 K/uL (ref 0.00–0.07)
Basophils Absolute: 0.1 K/uL (ref 0.0–0.1)
Basophils Relative: 1 %
Eosinophils Absolute: 0.3 K/uL (ref 0.0–0.5)
Eosinophils Relative: 4 %
HCT: 38.7 % (ref 36.0–46.0)
Hemoglobin: 13.1 g/dL (ref 12.0–15.0)
Immature Granulocytes: 0 %
Lymphocytes Relative: 56 %
Lymphs Abs: 5.1 K/uL — ABNORMAL HIGH (ref 0.7–4.0)
MCH: 31.7 pg (ref 26.0–34.0)
MCHC: 33.9 g/dL (ref 30.0–36.0)
MCV: 93.7 fL (ref 80.0–100.0)
Monocytes Absolute: 0.7 K/uL (ref 0.1–1.0)
Monocytes Relative: 7 %
Neutro Abs: 2.9 K/uL (ref 1.7–7.7)
Neutrophils Relative %: 32 %
Platelets: 369 K/uL (ref 150–400)
RBC: 4.13 MIL/uL (ref 3.87–5.11)
RDW: 13.1 % (ref 11.5–15.5)
WBC: 9.1 K/uL (ref 4.0–10.5)
nRBC: 0 % (ref 0.0–0.2)

## 2024-06-26 LAB — BASIC METABOLIC PANEL WITH GFR
Anion gap: 11 (ref 5–15)
BUN: 12 mg/dL (ref 6–20)
CO2: 21 mmol/L — ABNORMAL LOW (ref 22–32)
Calcium: 9 mg/dL (ref 8.9–10.3)
Chloride: 105 mmol/L (ref 98–111)
Creatinine, Ser: 1.03 mg/dL — ABNORMAL HIGH (ref 0.44–1.00)
GFR, Estimated: >60 mL/min (ref >60)
Glucose, Bld: 156 mg/dL — ABNORMAL HIGH (ref 70–99)
Potassium: 4 mmol/L (ref 3.5–5.1)
Sodium: 137 mmol/L (ref 135–145)

## 2024-06-26 LAB — TROPONIN I (HIGH SENSITIVITY)
Troponin I (High Sensitivity): 6 ng/L (ref <18)
Troponin I (High Sensitivity): 5 ng/L (ref <18)

## 2024-06-26 LAB — HCG, SERUM, QUALITATIVE: Preg, Serum: NEGATIVE

## 2024-06-26 MED ORDER — MECLIZINE HCL 25 MG PO TABS
25.0000 mg | ORAL_TABLET | Freq: Once | ORAL | Status: AC
  Administered 2024-06-26: 25 mg via ORAL
  Filled 2024-06-26: qty 1

## 2024-06-26 NOTE — ED Notes (Signed)
 No answer for vitals

## 2024-06-26 NOTE — ED Provider Triage Note (Signed)
 Emergency Medicine Provider Triage Evaluation Note  Darlene Gray , a 38 y.o. female  was evaluated in triage.  Pt complains of dizziness which began on Saturday after waking up from a nap.  Patient states she woke up and felt that she was not breathing.  Upon sitting up the room started spinning.  She states that positions have made it worse and better depending on how she moves.  She was diagnosed with possible vertigo back in May but states this is much worse than before.  The patient also endorses a severe headache.  Review of Systems  Positive:  Negative:   Physical Exam  BP (!) 157/105 (BP Location: Right Arm)   Pulse 99   Temp 98.8 F (37.1 C)   Resp 16   SpO2 99%  Gen:   Awake, no distress   Resp:  Normal effort  MSK:   Moves extremities without difficulty  Other:    Medical Decision Making  Medically screening exam initiated at 3:58 AM.  Appropriate orders placed.  Ronal Jenkins Pouch was informed that the remainder of the evaluation will be completed by another provider, this initial triage assessment does not replace that evaluation, and the importance of remaining in the ED until their evaluation is complete.     Logan Ubaldo NOV[place], [place] 06/26/24 [phone]

## 2024-06-26 NOTE — ED Triage Notes (Signed)
 Pt bib pov c/o dizziness and headache that started Saturday. Pt says it is worse laying down. She feels like the room is spinning. Pt says while taking a nap she stopped breathing which caused her to wake up abruptly which caused all her symptoms. Pt has taking ibuprofen  without relief.  Pt denies hx HTN and migraines  Pt states the pain travels throughout her head.   Pt experience symptoms like this in May when she had low CO2

## 2024-06-26 NOTE — ED Notes (Signed)
 No response x3 for vital recheck. OTF

## 2024-10-31 ENCOUNTER — Ambulatory Visit (HOSPITAL_COMMUNITY)

## 2024-10-31 ENCOUNTER — Ambulatory Visit (HOSPITAL_COMMUNITY): Admission: EM | Admit: 2024-10-31 | Discharge: 2024-10-31 | Disposition: A | Discharge status: Home / Self Care

## 2024-10-31 ENCOUNTER — Encounter (HOSPITAL_COMMUNITY): Payer: Self-pay

## 2024-10-31 DIAGNOSIS — M25512 Pain in left shoulder: Secondary | ICD-10-CM | POA: Diagnosis not present

## 2024-10-31 DIAGNOSIS — S46812A Strain of other muscles, fascia and tendons at shoulder and upper arm level, left arm, initial encounter: Secondary | ICD-10-CM

## 2024-10-31 MED ORDER — TIZANIDINE HCL 4 MG PO TABS: 4.0000 mg | ORAL_TABLET | Freq: Three times a day (TID) | ORAL | 0 refills | Status: AC | PRN

## 2024-10-31 MED ORDER — KETOROLAC TROMETHAMINE 60 MG/2ML IM SOLN
60.0000 mg | Freq: Once | INTRAMUSCULAR | Status: AC
  Administered 2024-10-31: 60 mg via INTRAMUSCULAR

## 2024-10-31 MED ORDER — KETOROLAC TROMETHAMINE 60 MG/2ML IM SOLN
INTRAMUSCULAR | Status: AC
  Filled 2024-10-31: qty 2

## 2024-10-31 NOTE — ED Provider Notes (Signed)
" °  PCP: Patient, No Pcp Per Chief Complaint: Shoulder Pain    Subjective:   HPI: Patient is a 39 y.o. female here for left shoulder pain.  Patient states that around a year she was doing a dance move with excessive arm movements and the next day had intense shoulder pain.  She points to her left trapezial region and says it radiates down towards her shoulder up towards her neck.  She denies any fall or injury at this time.  She has been attempting to treat this with Tiger balm patches, Advil , creams without much relief.  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  Past Medical History:  Diagnosis Date   Asthma    Ectopic pregnancy 2014   Gestational diabetes    first preg   Miscarriage 02/2023   UTI (urinary tract infection)     Medications Ordered Prior to Encounter[1]  BP (!) 140/97 (BP Location: Right Arm)   Pulse 93   Temp 98.3 F (36.8 C) (Oral)   Resp 16   LMP 10/25/2024   SpO2 96%        Objective:   Gen: Well developed, well nourished female in no acute distress. HEENT: Pupils equal, round, and reactive to light.  Conjunctiva non-injected.  Nares patent without discharge.  Oral mucosa is moist and pink.   Lungs: Normal respiratory effort, no cough MSK: Left shoulder tender to palpate over the main trapezial body on the left radiating up to the left paraspinal cervical region, she has full shoulder flexion at 180 degrees, rotator cuff testing is strong and equal, O'Brien's is negative, Yergason's is negative Ext: No cyanosis, clubbing, or edema.  Assessment/Plan:   Darlene Gray is a 39 y.o. female who was seen today for the following: 1. Acute pain of left shoulder (Primary) 2. Strain of left trapezius muscle, initial encounter - DG Shoulder Left; Standing - DG Shoulder Left - X-ray negative for any fracture, dislocation  - Radiologist reading minimal degenerative  change of GH joint  - Patient likely dealing from left trapezius strain - Will send in muscle relaxer for her to take up to 3 times daily - Instructed her that this can make her drowsy and to not drive on this medication - She should save a dose for nighttime when trying to rest - Continue ibuprofen , topical creams and Tylenol  - This should improve with time and rest  Follow-up/Education:   May return sooner as needed and encouraged to call/e-mail for additional questions or  worsening symptoms in the interim.  Krystal Lowing, DO Sports Medicine Fellow 10/31/2024 11:08 AM  Disclaimer: This transcription was electronically signed. It was transcribed by [place] and may contain errors in the text that were not recognized on proofreading.      [1]  No current facility-administered medications on file prior to encounter.   No current outpatient medications on file prior to encounter.     Lowing Krystal HERO, DO 10/31/24 1124  "

## 2024-10-31 NOTE — ED Triage Notes (Addendum)
 Pt has c/o left pain x 3 weeks, Pt denies injury to shoulder, but states she was dancing and started to hurt the next day. Pt has been using pain patches, advil  extra strength, creams, and Doan's Maximum strength  with no relief. Last dose of medication was Tuesday afternoon.

## 2024-11-01 ENCOUNTER — Ambulatory Visit (HOSPITAL_COMMUNITY): Payer: Self-pay
